# Patient Record
Sex: Male | Born: 1975 | Race: White | Hispanic: No | Marital: Married | Smoking: Never smoker
Health system: Southern US, Community
[De-identification: ages and names within clinical notes are randomized; demographics above are authoritative.]

## PROBLEM LIST (undated history)

## (undated) DIAGNOSIS — E559 Vitamin D deficiency, unspecified: Secondary | ICD-10-CM

## (undated) DIAGNOSIS — B019 Varicella without complication: Secondary | ICD-10-CM

## (undated) DIAGNOSIS — T7840XA Allergy, unspecified, initial encounter: Secondary | ICD-10-CM

## (undated) HISTORY — DX: Allergy, unspecified, initial encounter: T78.40XA

## (undated) HISTORY — DX: Varicella without complication: B01.9

---

## 2011-08-02 ENCOUNTER — Other Ambulatory Visit: Payer: Self-pay | Admitting: Neurosurgery

## 2011-08-02 DIAGNOSIS — M5020 Other cervical disc displacement, unspecified cervical region: Secondary | ICD-10-CM

## 2011-08-03 ENCOUNTER — Other Ambulatory Visit: Payer: Self-pay

## 2011-08-06 ENCOUNTER — Ambulatory Visit
Admission: RE | Admit: 2011-08-06 | Discharge: 2011-08-06 | Disposition: A | Discharge status: Home / Self Care | Payer: 59 | Source: Ambulatory Visit | Attending: Neurosurgery | Admitting: Neurosurgery

## 2011-08-06 DIAGNOSIS — M5020 Other cervical disc displacement, unspecified cervical region: Secondary | ICD-10-CM

## 2011-08-06 MED ORDER — TRIAMCINOLONE ACETONIDE 40 MG/ML IJ SUSP (RADIOLOGY)
60.0000 mg | Freq: Once | INTRAMUSCULAR | Status: AC
  Administered 2011-08-06: 60 mg via EPIDURAL

## 2011-08-06 MED ORDER — IOHEXOL 300 MG/ML  SOLN
1.0000 mL | Freq: Once | INTRAMUSCULAR | Status: AC | PRN
  Administered 2011-08-06: 1 mL via EPIDURAL

## 2013-11-02 ENCOUNTER — Other Ambulatory Visit: Payer: Self-pay | Admitting: Family Medicine

## 2013-11-02 MED ORDER — OSELTAMIVIR PHOSPHATE 75 MG PO CAPS: 75.0000 mg | ORAL_CAPSULE | Freq: Every day | ORAL | Status: DC

## 2014-01-11 ENCOUNTER — Encounter: Payer: Self-pay | Admitting: Family Medicine

## 2015-01-13 ENCOUNTER — Ambulatory Visit (INDEPENDENT_AMBULATORY_CARE_PROVIDER_SITE_OTHER): Payer: BLUE CROSS/BLUE SHIELD | Admitting: Family Medicine

## 2015-01-13 ENCOUNTER — Encounter: Payer: Self-pay | Admitting: Family Medicine

## 2015-01-13 VITALS — BP 109/70 | HR 68 | Temp 97.8°F | Ht 70.0 in | Wt 207.8 lb

## 2015-01-13 DIAGNOSIS — Z0000 Encounter for general adult medical examination without abnormal findings: Secondary | ICD-10-CM

## 2015-01-13 DIAGNOSIS — Z23 Encounter for immunization: Secondary | ICD-10-CM | POA: Diagnosis not present

## 2015-01-13 DIAGNOSIS — Z1212 Encounter for screening for malignant neoplasm of rectum: Secondary | ICD-10-CM | POA: Diagnosis not present

## 2015-01-13 LAB — POCT CBC
Granulocyte percent: 71.5 %G (ref 37–80)
HEMATOCRIT: 48.2 % (ref 43.5–53.7)
HEMOGLOBIN: 15.6 g/dL (ref 14.1–18.1)
Lymph, poc: 2.5 (ref 0.6–3.4)
MCH: 31 pg (ref 27–31.2)
MCHC: 32.4 g/dL (ref 31.8–35.4)
MCV: 95.5 fL (ref 80–97)
MPV: 8.4 fL (ref 0–99.8)
PLATELET COUNT, POC: 237 10*3/uL (ref 142–424)
POC Granulocyte: 6.6 (ref 2–6.9)
POC LYMPH %: 27.2 % (ref 10–50)
RBC: 5.05 M/uL (ref 4.69–6.13)
RDW, POC: 11.9 %
WBC: 9.3 10*3/uL (ref 4.6–10.2)

## 2015-01-13 NOTE — Progress Notes (Signed)
Subjective:  Patient ID: Julian Tran, male    DOB: 1976-04-24  Age: 39 y.o. MRN: [id number]  CC: Annual Exam   HPI Julian Tran presents for complete physical examination  History Julian Tran has a past medical history of Ruptured disc, cervical.   Julian Tran has no past surgical history on file.   His family history includes Alcohol abuse in his father; Depression in his mother.Julian Tran reports that Julian Tran has never smoked. Julian Tran does not have any smokeless tobacco history on file. Julian Tran reports that Julian Tran drinks about 1.8 oz of alcohol per week. Julian Tran reports that Julian Tran does not use illicit drugs.  No current outpatient prescriptions on file prior to visit.   No current facility-administered medications on file prior to visit.    ROS Review of Systems  Constitutional: Negative for fever, chills, diaphoresis, activity change, appetite change, fatigue and unexpected weight change.  HENT: Negative for congestion, ear pain, hearing loss, postnasal drip, rhinorrhea, sore throat, tinnitus and trouble swallowing.   Eyes: Negative for photophobia, pain, discharge and redness.  Respiratory: Negative for apnea, cough, choking, chest tightness, shortness of breath, wheezing and stridor.   Cardiovascular: Negative for chest pain, palpitations and leg swelling.  Gastrointestinal: Negative for nausea, vomiting, abdominal pain, diarrhea, constipation, blood in stool and abdominal distention.  Endocrine: Negative for cold intolerance, heat intolerance, polydipsia, polyphagia and polyuria.  Genitourinary: Negative for dysuria, urgency, frequency, hematuria, flank pain, enuresis, difficulty urinating and genital sores.  Musculoskeletal: Negative for joint swelling and arthralgias.  Skin: Negative for color change, rash and wound.  Allergic/Immunologic: Negative for immunocompromised state.  Neurological: Negative for dizziness, tremors, seizures, syncope, facial asymmetry, speech difficulty, weakness, light-headedness,  numbness and headaches.  Hematological: Does not bruise/bleed easily.  Psychiatric/Behavioral: Negative for suicidal ideas, hallucinations, behavioral problems, confusion, sleep disturbance, dysphoric mood, decreased concentration and agitation. The patient is not nervous/anxious and is not hyperactive.     Objective:  BP 109/70 mmHg  Pulse 68  Temp(Src) 97.8 F (36.6 C) (Oral)  Ht 5' 10" (1.778 m)  Wt 207 lb 12.8 oz (94.257 kg)  BMI 29.82 kg/m2  Physical Exam  Constitutional: Julian Tran is oriented to person, place, and time. Julian Tran appears well-developed and well-nourished.  HENT:  Head: Normocephalic and atraumatic.  Mouth/Throat: Oropharynx is clear and moist.  Eyes: EOM are normal. Pupils are equal, round, and reactive to light.  Neck: Normal range of motion. No tracheal deviation present. No thyromegaly present.  Cardiovascular: Normal rate, regular rhythm and normal heart sounds.  Exam reveals no gallop and no friction rub.   No murmur heard. Pulmonary/Chest: Breath sounds normal. Julian Tran has no wheezes. Julian Tran has no rales.  Abdominal: Soft. Julian Tran exhibits no mass. There is no tenderness.  Musculoskeletal: Normal range of motion. Julian Tran exhibits no edema.  Neurological: Julian Tran is alert and oriented to person, place, and time.  Skin: Skin is warm and dry.  Psychiatric: Julian Tran has a normal mood and affect.    Assessment & Plan:   Julian Tran was seen today for annual exam.  Diagnoses and all orders for this visit:  Well adult exam Orders: -     POCT CBC -     CMP14+EGFR -     NMR, lipoprofile -     PSA, total and free -     Thyroid Panel With TSH -     Vit D  25 hydroxy (rtn osteoporosis monitoring)  Screening for malignant neoplasm of the rectum Orders: -     Fecal occult blood, imunochemical  Other orders -     Tdap vaccine greater than or equal to 7yo IM   I have discontinued Mr. Lusty oseltamivir.  No orders of the defined types were placed in this encounter.   Anticipatory guidance  included T DaP vaccine. Safety concerns discussed including seatbelt use. Injury prevention.appropriate diet. Salt avoidance. Regular exercise.repeat exam annually.  Follow-up: Return in about 1 year (around 01/13/2016).  Claretta Fraise, M.D.

## 2015-01-14 LAB — NMR, LIPOPROFILE
Cholesterol: 159 mg/dL (ref 100–199)
HDL Cholesterol by NMR: 55 mg/dL (ref >39)
HDL PARTICLE NUMBER: 36.5 umol/L (ref >=30.5)
LDL PARTICLE NUMBER: 1171 nmol/L — AB (ref <1000)
LDL Size: 20.6 nm (ref >20.5)
LDL-C: 91 mg/dL (ref 0–99)
LP-IR SCORE: 46 — AB (ref <=45)
SMALL LDL PARTICLE NUMBER: 586 nmol/L — AB (ref <=527)
Triglycerides by NMR: 66 mg/dL (ref 0–149)

## 2015-01-14 LAB — VITAMIN D 25 HYDROXY (VIT D DEFICIENCY, FRACTURES): VIT D 25 HYDROXY: 20.5 ng/mL — AB (ref 30.0–100.0)

## 2015-01-14 LAB — CMP14+EGFR
A/G RATIO: 2.3 (ref 1.1–2.5)
ALBUMIN: 5.1 g/dL (ref 3.5–5.5)
ALT: 24 IU/L (ref 0–44)
AST: 19 IU/L (ref 0–40)
Alkaline Phosphatase: 49 IU/L (ref 39–117)
BILIRUBIN TOTAL: 1.6 mg/dL — AB (ref 0.0–1.2)
BUN/Creatinine Ratio: 15 (ref 9–20)
BUN: 12 mg/dL (ref 6–24)
CALCIUM: 9.6 mg/dL (ref 8.7–10.2)
CHLORIDE: 98 mmol/L (ref 97–108)
CO2: 26 mmol/L (ref 18–29)
Creatinine, Ser: 0.8 mg/dL (ref 0.76–1.27)
GFR calc Af Amer: 128 mL/min/{1.73_m2} (ref >59)
GFR, EST NON AFRICAN AMERICAN: 111 mL/min/{1.73_m2} (ref >59)
GLUCOSE: 76 mg/dL (ref 65–99)
Globulin, Total: 2.2 g/dL (ref 1.5–4.5)
POTASSIUM: 4 mmol/L (ref 3.5–5.2)
SODIUM: 143 mmol/L (ref 134–144)
TOTAL PROTEIN: 7.3 g/dL (ref 6.0–8.5)

## 2015-01-14 LAB — PSA, TOTAL AND FREE
PSA, Free Pct: 15.5 %
PSA, Free: 0.17 ng/mL
PSA: 1.1 ng/mL (ref 0.0–4.0)

## 2015-01-14 LAB — THYROID PANEL WITH TSH
Free Thyroxine Index: 2.5 (ref 1.2–4.9)
T3 Uptake Ratio: 24 % (ref 24–39)
T4, Total: 10.5 ug/dL (ref 4.5–12.0)
TSH: 1.29 u[IU]/mL (ref 0.450–4.500)

## 2015-01-16 ENCOUNTER — Other Ambulatory Visit: Payer: Self-pay | Admitting: Family Medicine

## 2015-01-16 LAB — FECAL OCCULT BLOOD, IMMUNOCHEMICAL: Fecal Occult Bld: NEGATIVE

## 2015-01-16 MED ORDER — VITAMIN D (ERGOCALCIFEROL) 1.25 MG (50000 UNIT) PO CAPS: 50000.0000 [IU] | ORAL_CAPSULE | ORAL | Status: DC

## 2015-01-17 ENCOUNTER — Encounter: Payer: Self-pay | Admitting: Family Medicine

## 2015-02-21 ENCOUNTER — Telehealth: Payer: Self-pay | Admitting: Family Medicine

## 2015-02-21 NOTE — Telephone Encounter (Signed)
I will be happy to see him for these things please schedule

## 2015-02-21 NOTE — Telephone Encounter (Signed)
Patient aware and he will call back to schedule.

## 2015-05-13 ENCOUNTER — Encounter: Payer: Self-pay | Admitting: *Deleted

## 2015-06-09 ENCOUNTER — Encounter (HOSPITAL_BASED_OUTPATIENT_CLINIC_OR_DEPARTMENT_OTHER): Payer: Self-pay | Admitting: *Deleted

## 2015-06-09 ENCOUNTER — Other Ambulatory Visit: Payer: Self-pay | Admitting: Urology

## 2015-06-10 ENCOUNTER — Encounter (HOSPITAL_BASED_OUTPATIENT_CLINIC_OR_DEPARTMENT_OTHER): Payer: Self-pay | Admitting: *Deleted

## 2015-06-10 NOTE — Progress Notes (Signed)
NPO AFTER MN.  ARRIVE AT 0945.  NEEDS HG.  

## 2015-06-15 ENCOUNTER — Encounter (HOSPITAL_BASED_OUTPATIENT_CLINIC_OR_DEPARTMENT_OTHER): Payer: Self-pay | Admitting: Anesthesiology

## 2015-06-15 ENCOUNTER — Encounter (HOSPITAL_BASED_OUTPATIENT_CLINIC_OR_DEPARTMENT_OTHER)
Admission: RE | Disposition: A | Discharge status: Home / Self Care | Payer: Self-pay | Source: Ambulatory Visit | Attending: Urology

## 2015-06-15 ENCOUNTER — Ambulatory Visit (HOSPITAL_BASED_OUTPATIENT_CLINIC_OR_DEPARTMENT_OTHER): Payer: BLUE CROSS/BLUE SHIELD | Admitting: Anesthesiology

## 2015-06-15 ENCOUNTER — Ambulatory Visit (HOSPITAL_BASED_OUTPATIENT_CLINIC_OR_DEPARTMENT_OTHER)
Admission: RE | Admit: 2015-06-15 | Discharge: 2015-06-15 | Disposition: A | Discharge status: Home / Self Care | Payer: BLUE CROSS/BLUE SHIELD | Source: Ambulatory Visit | Attending: Urology | Admitting: Urology

## 2015-06-15 DIAGNOSIS — Z302 Encounter for sterilization: Secondary | ICD-10-CM | POA: Insufficient documentation

## 2015-06-15 HISTORY — PX: VASECTOMY: SHX75

## 2015-06-15 HISTORY — DX: Vitamin D deficiency, unspecified: E55.9

## 2015-06-15 LAB — POCT HEMOGLOBIN-HEMACUE: Hemoglobin: 17 g/dL (ref 13.0–17.0)

## 2015-06-15 SURGERY — VASECTOMY
Anesthesia: General | Site: Scrotum | Laterality: Bilateral

## 2015-06-15 MED ORDER — CEFAZOLIN SODIUM 1-5 GM-% IV SOLN
1.0000 g | INTRAVENOUS | Status: DC
  Filled 2015-06-15: qty 50

## 2015-06-15 MED ORDER — BUPIVACAINE HCL (PF) 0.25 % IJ SOLN
INTRAMUSCULAR | Status: DC | PRN
  Administered 2015-06-15: 10 mL

## 2015-06-15 MED ORDER — ONDANSETRON HCL 4 MG/2ML IJ SOLN
INTRAMUSCULAR | Status: DC | PRN
  Administered 2015-06-15: 4 mg via INTRAVENOUS

## 2015-06-15 MED ORDER — FENTANYL CITRATE (PF) 100 MCG/2ML IJ SOLN
25.0000 ug | INTRAMUSCULAR | Status: DC | PRN
  Filled 2015-06-15: qty 1

## 2015-06-15 MED ORDER — PROPOFOL 10 MG/ML IV BOLUS
INTRAVENOUS | Status: DC | PRN
  Administered 2015-06-15: 100 mg via INTRAVENOUS
  Administered 2015-06-15: 200 mg via INTRAVENOUS

## 2015-06-15 MED ORDER — LACTATED RINGERS IV SOLN
INTRAVENOUS | Status: DC
  Filled 2015-06-15: qty 1000

## 2015-06-15 MED ORDER — DEXAMETHASONE SODIUM PHOSPHATE 4 MG/ML IJ SOLN
INTRAMUSCULAR | Status: DC | PRN
  Administered 2015-06-15: 10 mg via INTRAVENOUS

## 2015-06-15 MED ORDER — MIDAZOLAM HCL 5 MG/5ML IJ SOLN
INTRAMUSCULAR | Status: DC | PRN
  Administered 2015-06-15: 2 mg via INTRAVENOUS

## 2015-06-15 MED ORDER — LIDOCAINE HCL (CARDIAC) 20 MG/ML IV SOLN
INTRAVENOUS | Status: DC | PRN
Start: 2015-06-15 — End: 2015-06-15
  Administered 2015-06-15: 100 mg via INTRAVENOUS

## 2015-06-15 MED ORDER — LACTATED RINGERS IV SOLN
INTRAVENOUS | Status: DC
  Administered 2015-06-15: 11:00:00 via INTRAVENOUS
  Filled 2015-06-15: qty 1000

## 2015-06-15 MED ORDER — KETOROLAC TROMETHAMINE 30 MG/ML IJ SOLN
INTRAMUSCULAR | Status: DC | PRN
  Administered 2015-06-15: 30 mg via INTRAVENOUS

## 2015-06-15 MED ORDER — OXYCODONE HCL 5 MG PO TABS
ORAL_TABLET | ORAL | Status: AC
  Filled 2015-06-15: qty 1

## 2015-06-15 MED ORDER — FENTANYL CITRATE (PF) 100 MCG/2ML IJ SOLN
INTRAMUSCULAR | Status: DC | PRN
  Administered 2015-06-15: 100 ug via INTRAVENOUS

## 2015-06-15 MED ORDER — MEPERIDINE HCL 25 MG/ML IJ SOLN
6.2500 mg | INTRAMUSCULAR | Status: DC | PRN
  Filled 2015-06-15: qty 1

## 2015-06-15 MED ORDER — CEFAZOLIN SODIUM-DEXTROSE 2-3 GM-% IV SOLR
2.0000 g | INTRAVENOUS | Status: AC
  Administered 2015-06-15: 2 g via INTRAVENOUS
  Filled 2015-06-15: qty 50

## 2015-06-15 MED ORDER — HYDROCODONE-ACETAMINOPHEN 5-325 MG PO TABS: 1.0000 | ORAL_TABLET | Freq: Four times a day (QID) | ORAL | Status: DC | PRN

## 2015-06-15 MED ORDER — FENTANYL CITRATE (PF) 100 MCG/2ML IJ SOLN
INTRAMUSCULAR | Status: AC
  Filled 2015-06-15: qty 2

## 2015-06-15 MED ORDER — OXYCODONE HCL 5 MG PO TABS
5.0000 mg | ORAL_TABLET | Freq: Once | ORAL | Status: AC
  Administered 2015-06-15: 5 mg via ORAL
  Filled 2015-06-15: qty 1

## 2015-06-15 MED ORDER — MIDAZOLAM HCL 2 MG/2ML IJ SOLN
INTRAMUSCULAR | Status: AC
  Filled 2015-06-15: qty 2

## 2015-06-15 MED ORDER — ACETAMINOPHEN 10 MG/ML IV SOLN
INTRAVENOUS | Status: DC | PRN
Start: 2015-06-15 — End: 2015-06-15
  Administered 2015-06-15: 1000 mg via INTRAVENOUS

## 2015-06-15 MED ORDER — PROMETHAZINE HCL 25 MG/ML IJ SOLN
6.2500 mg | INTRAMUSCULAR | Status: DC | PRN
  Filled 2015-06-15: qty 1

## 2015-06-15 MED ORDER — CEFAZOLIN SODIUM-DEXTROSE 2-3 GM-% IV SOLR
INTRAVENOUS | Status: AC
  Filled 2015-06-15: qty 50

## 2015-06-15 SURGICAL SUPPLY — 35 items
BLADE CLIPPER SURG (BLADE) IMPLANT
BLADE SURG 15 STRL LF DISP TIS (BLADE) ×1 IMPLANT
BLADE SURG 15 STRL SS (BLADE) ×2
BNDG GAUZE ELAST 4 BULKY (GAUZE/BANDAGES/DRESSINGS) ×3 IMPLANT
CLOTH BEACON ORANGE TIMEOUT ST (SAFETY) ×3 IMPLANT
COVER BACK TABLE 60X90IN (DRAPES) ×3 IMPLANT
COVER MAYO STAND STRL (DRAPES) ×3 IMPLANT
DRAPE PED LAPAROTOMY (DRAPES) ×3 IMPLANT
ELECT NEEDLE TIP 2.8 STRL (NEEDLE) ×3 IMPLANT
ELECT REM PT RETURN 9FT ADLT (ELECTROSURGICAL) ×3
ELECTRODE REM PT RTRN 9FT ADLT (ELECTROSURGICAL) ×1 IMPLANT
GLOVE BIO SURGEON STRL SZ8 (GLOVE) ×3 IMPLANT
GLOVE BIOGEL M 6.5 STRL (GLOVE) ×3 IMPLANT
GLOVE BIOGEL PI IND STRL 6.5 (GLOVE) ×1 IMPLANT
GLOVE BIOGEL PI IND STRL 7.5 (GLOVE) ×1 IMPLANT
GLOVE BIOGEL PI INDICATOR 6.5 (GLOVE) ×2
GLOVE BIOGEL PI INDICATOR 7.5 (GLOVE) ×2
GOWN STRL REUS W/TWL LRG LVL3 (GOWN DISPOSABLE) ×3 IMPLANT
GOWN STRL REUS W/TWL XL LVL3 (GOWN DISPOSABLE) ×3 IMPLANT
GOWN W/2 COTTON TOWELS 2 STD (GOWNS) IMPLANT
MANIFOLD NEPTUNE II (INSTRUMENTS) IMPLANT
NEEDLE HYPO 25X1 1.5 SAFETY (NEEDLE) IMPLANT
NEEDLE HYPO 25X5/8 SAFETYGLIDE (NEEDLE) IMPLANT
NS IRRIG 500ML POUR BTL (IV SOLUTION) IMPLANT
PACK BASIN DAY SURGERY FS (CUSTOM PROCEDURE TRAY) ×3 IMPLANT
PENCIL BUTTON HOLSTER BLD 10FT (ELECTRODE) ×3 IMPLANT
SUPPORT SCROTAL LG STRP (MISCELLANEOUS) ×2 IMPLANT
SUPPORTER ATHLETIC LG (MISCELLANEOUS) ×1
SUT CHROMIC 3 0 PS 2 (SUTURE) ×3 IMPLANT
SUT SILK 2 0 (SUTURE) ×2
SUT SILK 2-0 18XBRD TIE 12 (SUTURE) ×1 IMPLANT
SYR CONTROL 10ML LL (SYRINGE) IMPLANT
TOWEL OR 17X24 6PK STRL BLUE (TOWEL DISPOSABLE) ×3 IMPLANT
TRAY DSU PREP LF (CUSTOM PROCEDURE TRAY) ×3 IMPLANT
WATER STERILE IRR 500ML POUR (IV SOLUTION) IMPLANT

## 2015-06-15 NOTE — Op Note (Signed)
Preoperative diagnosis: Desires Sterilization Postop diagnosis: Same  Procedure: 1.  Vasectomy  Attending: Nicolette Bang  Anesthesia: General  Estimated blood loss: 5 cc  Drains: 1. none  Specimens: bilateral cross section of vas deferens  Antibiotics: none  Findings: bilateral thickened cord with difficult to palpate vas deferens  Indications: Patient is a 38 year old who desires sterilization. After discussing options he desires to proceed with vasectomy.  Procedure in detail: Prior to procedure consent was obtained. Patient was brought to the operating room and briefing was done sure correct patient, correct procedure, correct site.  General anesthesia was in administered patient was placed in the supine position.  The patients genetalia was prepped and draped in the usual, sterile fashion.  A 0.5 cm incision was made in the left hemiscrotum.  The vas was then brought to the level of the incision.  Then using a vas grasper we isolated the vas deferens.  We then used electrocautery to free the vas from the perivasal tissue.  Once the vas was isolated we then placed a clamp on either side of the vas.  Then sharply incised the vas and removed a 1 cm section of vas deferens.  We then cauterized the ends of the vein as and then ligated the vessels with 0 silk suture.  We then placed the individual limbs of vas deferens and separate compartments.  We closed the overlying skin with 3-0 chromic in interrupted fashion.  A similar technique was used on the left side to isolate the left vas.  Left vas was ligated in a similar fashion.  Good hemostasis was also noted in the left hemiscrotum.  We then returned the ends of the vas to separate compartments.  We then closed the overlying skin with 3-0 chromic in an interrupted fashion. This then concluded the procedure which was well tolerated by the patient. Complications: None Condition: Stable, x-rayed, transferred to PACU. Plan: Pt is to be  discharged home. Followup in 6-8 weeks with a semen sample

## 2015-06-15 NOTE — Discharge Instructions (Signed)
Vasectomy, Care After Refer to this sheet in the next few weeks. These instructions provide you with information on caring for yourself after your procedure. Your health care provider may also give you more specific instructions. Your treatment has been planned according to current medical practices, but problems sometimes occur. Call your health care provider if you have any problems or questions after your procedure. WHAT TO EXPECT AFTER THE PROCEDURE After your procedure, it is typical to have the following:  Slight swelling or redness or both at the surgical site.  Mild pain or discomfort in the scrotum.  Some oozing of blood from the cuts (incisions) made by the surgeon is normal during the first day or two after the procedure.  Blood in the ejaculate is common and typically clears after a few days. HOME CARE INSTRUCTIONS   Only take over-the-counter or prescription medicines for pain, discomfort, or fever as directed by your health care provider.  Avoid using nonsteroidal anti-inflammatory drugs (NSAIDs) because these can make bleeding worse.  Apply ice to the injured area:  Put ice in a plastic bag.  Place a towel between your skin and the bag.  Leave the ice on for 20 minutes, 2-3 times a day.  Avoid being active for the first 2 days after surgery.  Wear a supporter while moving around for the first week after surgery. You may add some sterile fluffed bandages or a clean washcloth to the scrotal support if the scrotal support irritates your skin.  Do not participate in sports or perform heavy physical labor for at least 2 weeks.  You may have protected intercourse 7-10 days after your procedure. Remember, you are not sterile until follow-up specimens show no sperm in your ejaculate.  Be sure to follow up with your surgeon as instructed to confirm sterility. It usually requires multiple ejaculations to clear the sperm located beyond the vasectomy site of blockage. You will  need at least two specimens showing an absence of sperm before you can resume unprotected intercourse. SEEK MEDICAL CARE IF:   You have redness, swelling, or increasing pain in the wounds or testicles (scrotum).  You see pus coming from the wound.  You have a fever.  You notice a foul smell coming from the wound or dressing.  You notice a breaking open of the stitches (suture) line or wound edges even after sutures have been removed.  You have increased bleeding from the wounds. SEEK IMMEDIATE MEDICAL CARE IF:   You develop a rash.  You have difficulty breathing.  You have any reaction or side effects to medicines given. MAKE SURE YOU:  Understand these instructions.  Will watch your condition.  Will get help right away if you are not doing well or get worse. Document Released: 04/27/2005 Document Revised: 10/13/2013 Document Reviewed: 04/27/2013 [facility] Patient Information 2015 [place], [place]. This information is not intended to replace advice given to you by your health care provider. Make sure you discuss any questions you have with your health care provider.    Vasectomy A vasectomy is tying (with or without cutting) the tube that collects the sperm from the testicle (vas deferens). The vasectomy blocks the sperm from going through the vas deferens and penis so that during sexual intercourse, the sperm does not go into the vagina. Vasectomy is safe, with very rare complications. It does not affect your sexual desire or performance. A vasectomy does not prevent sexually transmitted diseases. Because vasectomy is considered permanent, you should not have it done  until you are sure you do not want any more children. You and your partner should be in full agreement to have the procedure. Your decision to have a vasectomy should not be made during a stressful situation. This includes loss of a pregnancy, illness, death of a spouse, or divorce. There are other means of  contraception that can be used until you are completely sure you want this procedure done.  LET [facility] CARE PROVIDER KNOW ABOUT:   Any allergies you have.  All medicines you are taking, including vitamins, herbs, eye drops, creams, and over-the-counter medicines.  Previous problems you or members of your family have had with the use of anesthetics.  Any blood disorders you have.  Previous surgeries you have had.  Medical conditions you have. RISKS AND COMPLICATIONS Generally, vasectomy is a safe procedure. However, as with any procedure, complications can occur. Possible complications include:  Failure of the procedure to cause infertility. This means you would still be able to get a male pregnant. Even after sterilization has been achieved, there is a 1 in 10,000 chance that the two cut ends may reconnect (recanalization).  Infection. A germ starts growing in the wound. This can usually be treated with antibiotic medicine(s).  An allergic reaction to the anesthetic or other medicine given.  Bleeding. Blood may seep under the skin so that the scrotum and penis appear to be bruised. Sometimes the scrotum can swell and get the size of a grapefruit. This usually disappears without treatment within a week or two. BEFORE THE PROCEDURE  Do not take aspirin or aspirin-containing products for 7 days prior to your procedure.  Do not take nonsteroidal anti-inflammatory products for 7 days prior to your procedure.  You may be instructed to wash with soap before coming in for your procedure. PROCEDURE  The scrotum is cleaned with bacteria-killing soap, and the health care provider finds the vas deferens.  Each side of the scrotum is numbed.  A very small cut (incision) is made, and the vas deferens are pulled out of the scrotum. The vas deferens are then tied off, cut, or may be burned (cauterized) at the ends.  Sometimes the vas deferens are pulled out from the scrotum through a  puncture wound. This is done with a special instrument without an incision.  The vas deferens are then put back into the scrotum, and the incision or puncture wound is closed. Absorbable suture material that will dissolve and not need to be removed is commonly used.  After surgery, sperm may still be left in the vas deferens for 1-3 months. Because of this, other means of contraception should be used until your health care provider examines you and finds there are no sperm in your seminal fluid. AFTER THE PROCEDURE After the procedure, you will be taken to the recovery area. Your progress will be watched and checked. Once you are awake, stable, and taking fluids well, you will be allowed to go home as long as there are no problems.  Document Released: 12/29/2002 Document Revised: 10/13/2013 Document Reviewed: 04/27/2013 [facility] Patient Information 2015 [place], [place]. This information is not intended to replace advice given to you by your health care provider. Make sure you discuss any questions you have with your health care provider.     Post Anesthesia Home Care Instructions  Activity: Get plenty of rest for the remainder of the day. A responsible adult should stay with you for 24 hours following the procedure.  For the next 24  hours, DO NOT: -Drive a car -Paediatric nurse -Drink alcoholic beverages -Take any medication unless instructed by your physician -Make any legal decisions or sign important papers.  Meals: Start with liquid foods such as gelatin or soup. Progress to regular foods as tolerated. Avoid greasy, spicy, heavy foods. If nausea and/or vomiting occur, drink only clear liquids until the nausea and/or vomiting subsides. Call your physician if vomiting continues.  Special Instructions/Symptoms: Your throat may feel dry or sore from the anesthesia or the breathing tube placed in your throat during surgery. If this causes discomfort, gargle with warm salt water. The  discomfort should disappear within 24 hours.  If you had a scopolamine patch placed behind your ear for the management of post- operative nausea and/or vomiting:  1. The medication in the patch is effective for 72 hours, after which it should be removed.  Wrap patch in a tissue and discard in the trash. Wash hands thoroughly with soap and water. 2. You may remove the patch earlier than 72 hours if you experience unpleasant side effects which may include dry mouth, dizziness or visual disturbances. 3. Avoid touching the patch. Wash your hands with soap and water after contact with the patch.

## 2015-06-15 NOTE — Transfer of Care (Signed)
Immediate Anesthesia Transfer of Care Note  Patient: Julian Tran  Procedure(s) Performed: Procedure(s): VASECTOMY (Bilateral)  Patient Location: PACU  Anesthesia Type:General  Level of Consciousness: awake and oriented  Airway & Oxygen Therapy: Patient Spontanous Breathing and Patient connected to nasal cannula oxygen  Post-op Assessment: Report given to RN  Post vital signs: Reviewed and stable  Last Vitals:  Filed Vitals:   06/15/15 1000  BP: 116/69  Pulse: 63  Temp: 36.9 C  Resp: 12    Complications: No apparent anesthesia complications

## 2015-06-15 NOTE — Anesthesia Procedure Notes (Signed)
Procedure Name: LMA Insertion Date/Time: 06/15/2015 11:15 AM Performed by: Bethena Roys T Pre-anesthesia Checklist: Patient identified, Emergency Drugs available, Suction available and Patient being monitored Patient Re-evaluated:Patient Re-evaluated prior to inductionOxygen Delivery Method: Circle System Utilized Preoxygenation: Pre-oxygenation with 100% oxygen Intubation Type: IV induction Ventilation: Mask ventilation without difficulty LMA: LMA inserted LMA Size: 5.0 Number of attempts: 1 Airway Equipment and Method: Bite block Placement Confirmation: positive ETCO2 Tube secured with: Tape Dental Injury: Teeth and Oropharynx as per pre-operative assessment

## 2015-06-15 NOTE — Brief Op Note (Signed)
06/15/2015  11:43 AM  PATIENT:  Julian Tran  39 y.o. male  PRE-OPERATIVE DIAGNOSIS:  DESIRES STERILIZATION  POST-OPERATIVE DIAGNOSIS:  DESIRES STERILIZATION  PROCEDURE:  Procedure(s): VASECTOMY (Bilateral)  SURGEON:  Surgeon(s) and Role:    * Cleon Gustin, MD - Primary  PHYSICIAN ASSISTANT:   ASSISTANTS: none   ANESTHESIA:   local and general  EBL:  Total I/O In: 200 [I.V.:200] Out: -   BLOOD ADMINISTERED:none  DRAINS: none   LOCAL MEDICATIONS USED:  MARCAINE     SPECIMEN:  Source of Specimen:  bilateral vas cross section  DISPOSITION OF SPECIMEN:  PATHOLOGY  COUNTS:  YES  TOURNIQUET:  * No tourniquets in log *  DICTATION: .Note written in EPIC  PLAN OF CARE: Discharge to home after PACU  PATIENT DISPOSITION:  PACU - hemodynamically stable.   Delay start of Pharmacological VTE agent (>24hrs) due to surgical blood loss or risk of bleeding: not applicable

## 2015-06-15 NOTE — Anesthesia Preprocedure Evaluation (Signed)
Anesthesia Evaluation  Patient identified by MRN, date of birth, ID band Patient awake    Reviewed: Allergy & Precautions, NPO status , Patient's Chart, lab work & pertinent test results  Airway Mallampati: II  TM Distance: >3 FB Neck ROM: Full    Dental no notable dental hx.    Pulmonary neg pulmonary ROS,  breath sounds clear to auscultation  Pulmonary exam normal       Cardiovascular negative cardio ROS Normal cardiovascular examRhythm:Regular Rate:Normal     Neuro/Psych negative neurological ROS  negative psych ROS   GI/Hepatic negative GI ROS, Neg liver ROS,   Endo/Other  negative endocrine ROS  Renal/GU negative Renal ROS  negative genitourinary   Musculoskeletal negative musculoskeletal ROS (+)   Abdominal   Peds negative pediatric ROS (+)  Hematology negative hematology ROS (+)   Anesthesia Other Findings   Reproductive/Obstetrics negative OB ROS                             Anesthesia Physical Anesthesia Plan  ASA: I  Anesthesia Plan: General   Post-op Pain Management:    Induction: Intravenous  Airway Management Planned: LMA  Additional Equipment:   Intra-op Plan:   Post-operative Plan: Extubation in OR  Informed Consent: I have reviewed the patients History and Physical, chart, labs and discussed the procedure including the risks, benefits and alternatives for the proposed anesthesia with the patient or authorized representative who has indicated his/her understanding and acceptance.   Dental advisory given  Plan Discussed with: CRNA  Anesthesia Plan Comments:         Anesthesia Quick Evaluation

## 2015-06-15 NOTE — Anesthesia Postprocedure Evaluation (Signed)
  Anesthesia Post-op Note  Patient: Julian Tran  Procedure(s) Performed: Procedure(s) (LRB): VASECTOMY (Bilateral)  Patient Location: PACU  Anesthesia Type: General  Level of Consciousness: awake and alert   Airway and Oxygen Therapy: Patient Spontanous Breathing  Post-op Pain: mild  Post-op Assessment: Post-op Vital signs reviewed, Patient's Cardiovascular Status Stable, Respiratory Function Stable, Patent Airway and No signs of Nausea or vomiting  Last Vitals:  Filed Vitals:   06/15/15 1210  BP:   Pulse: 52  Temp:   Resp: 18    Post-op Vital Signs: stable   Complications: No apparent anesthesia complications

## 2015-06-16 ENCOUNTER — Encounter (HOSPITAL_BASED_OUTPATIENT_CLINIC_OR_DEPARTMENT_OTHER): Payer: Self-pay | Admitting: Urology

## 2015-06-18 NOTE — H&P (Signed)
Urology Admission H&P  Chief Complaint: desires sterilization  History of Present Illness: Julian Tran is a 39yo here for vasectomy. He has 2 healthy children. On office exam his vas are small and difficult to palpate.   Past Medical History  Diagnosis Date  . Vitamin D deficiency   . GERD (gastroesophageal reflux disease)    Past Surgical History  Procedure Laterality Date  . No past surgeries    . Vasectomy Bilateral 06/15/2015    Procedure: VASECTOMY;  Surgeon: Cleon Gustin, MD;  Location: [facility];  Service: Urology;  Laterality: Bilateral;    Home Medications:  No prescriptions prior to admission   Allergies: No Known Allergies  Family History  Problem Relation Age of Onset  . Depression Mother   . Alcohol abuse Father    Social History:  reports that he has never smoked. He has never used smokeless tobacco. He reports that he drinks about 1.8 oz of alcohol per week. He reports that he does not use illicit drugs.  Review of Systems  All other systems reviewed and are negative.   Physical Exam:  Vital signs in last 24 hours:   Physical Exam  Constitutional: He is oriented to person, place, and time. He appears well-developed and well-nourished.  HENT:  Head: Normocephalic and atraumatic.  Eyes: EOM are normal. Pupils are equal, round, and reactive to light.  Neck: Normal range of motion. Neck supple.  Cardiovascular: Normal rate and regular rhythm.   Respiratory: Effort normal and breath sounds normal.  GI: Soft. Bowel sounds are normal.  Genitourinary: Penis normal.  Musculoskeletal: Normal range of motion.  Neurological: He is alert and oriented to person, place, and time.  Skin: Skin is warm and dry.  Psychiatric: He has a normal mood and affect. His behavior is normal. Judgment and thought content normal.    Laboratory Data:  No results found for this or any previous visit (from the past 24 hour(s)). No results found for this or any  previous visit (from the past 240 hour(s)). Creatinine: No results for input(s): CREATININE in the last 168 hours.   Impression/Assessment:  Desires sterilization  Plan:  Risks/benefits/alternatives to vasectomy was explained to the patient and he understands and wishes to proceed with surgery  Julian Tran 06/18/2015, 8:02 AM

## 2015-07-20 ENCOUNTER — Ambulatory Visit: Payer: BLUE CROSS/BLUE SHIELD | Admitting: Family Medicine

## 2015-07-26 ENCOUNTER — Ambulatory Visit: Payer: BLUE CROSS/BLUE SHIELD | Admitting: Family Medicine

## 2016-01-12 ENCOUNTER — Encounter: Payer: Self-pay | Admitting: Internal Medicine

## 2016-01-12 ENCOUNTER — Ambulatory Visit (INDEPENDENT_AMBULATORY_CARE_PROVIDER_SITE_OTHER): Payer: BLUE CROSS/BLUE SHIELD | Admitting: Internal Medicine

## 2016-01-12 VITALS — BP 110/80 | HR 67 | Temp 98.4°F | Ht 69.5 in | Wt 217.0 lb

## 2016-01-12 DIAGNOSIS — H6982 Other specified disorders of Eustachian tube, left ear: Secondary | ICD-10-CM | POA: Diagnosis not present

## 2016-01-12 DIAGNOSIS — H65111 Acute and subacute allergic otitis media (mucoid) (sanguinous) (serous), right ear: Secondary | ICD-10-CM

## 2016-01-12 MED ORDER — PREDNISONE 10 MG PO TABS: ORAL_TABLET | ORAL | Status: DC

## 2016-01-12 MED ORDER — CEFUROXIME AXETIL 500 MG PO TABS
500.0000 mg | ORAL_TABLET | Freq: Two times a day (BID) | ORAL | Status: DC
Start: 2016-01-12 — End: 2016-07-12

## 2016-01-12 NOTE — Patient Instructions (Signed)

## 2016-01-12 NOTE — Progress Notes (Signed)
HPI  Julian Tran presents to the clinic today to establish care. He is transferring care from Dr. Livia Snellen in [place].   He went to [facility] 1 week ago. He was diagnosed with an ear infection (unsure of which ear) and bronchitis. He was treated with Augmentin, Albuterol and Tessalon. He does feel like his respiratory symptoms have improved. He has had some difficulty hearing since that time. He denies fever, chills or body aches. He has not tried anything OTC>  Flu: 07/2015 Tetanus: 12/2014 PSA Screen: 12/2014 Vision Screening: as needed Dentist: annually  Past Medical History  Diagnosis Date  . Vitamin D deficiency   . Chicken pox     No current outpatient prescriptions on file.   No current facility-administered medications for this visit.    No Known Allergies  Family History  Problem Relation Age of Onset  . Depression Mother   . Alcohol abuse Father   . Alcohol abuse Brother   . Alcohol abuse Brother   . Diabetes Neg Hx   . Heart disease Neg Hx   . Stroke Neg Hx     Social History   Social History  . Marital Status: Married    Spouse Name: N/A  . Number of Children: N/A  . Years of Education: N/A   Occupational History  . Not on file.   Social History Main Topics  . Smoking status: Never Smoker   . Smokeless tobacco: Never Used  . Alcohol Use: 1.8 oz/week    3 Standard drinks or equivalent per week     Comment: OCCASIONAL  . Drug Use: No  . Sexual Activity: Yes    Birth Control/ Protection: Surgical   Other Topics Concern  . Not on file   Social History Narrative    ROS:  Constitutional: Denies fever, malaise, fatigue, headache or abrupt weight changes.  HEENT: Julian Tran reports ear pressure, difficulty hearing. Denies eye pain, eye redness, ear pain, ringing in the ears, wax buildup, runny nose, nasal congestion, bloody nose, or sore throat. Respiratory: Denies difficulty breathing, shortness of breath, cough or sputum production.   Cardiovascular:  Denies chest pain, chest tightness, palpitations or swelling in the hands or feet.  Neurological: Denies dizziness, difficulty with memory, difficulty with speech or problems with balance and coordination.  Psych: Denies anxiety, depression, SI/HI.  No other specific complaints in a complete review of systems (except as listed in HPI above).  PE:  BP 110/80 mmHg  Pulse 67  Temp(Src) 98.4 F (36.9 C) (Oral)  Ht 5' 9.5" (1.765 m)  Wt 217 lb (98.431 kg)  BMI 31.60 kg/m2  SpO2 98% Wt Readings from Last 3 Encounters:  01/12/16 217 lb (98.431 kg)  06/15/15 210 lb (95.255 kg)  01/13/15 207 lb 12.8 oz (94.257 kg)    General: Appears his stated age, well developed, well nourished in NAD. HEENT: Head: normal shape and size; Eyes: sclera white, no icterus, conjunctiva pink; Right Ear: Tm's red and intact, dull light reflex, + mucous effusion; Left Ear: TM gray and intact, normal light reflex, + serous effusion; Throat/Mouth: Teeth present, mucosa pink and moist, no lesions or ulcerations noted.  Neck: No adenopathy noted.  Cardiovascular: Normal rate and rhythm. S1,S2 noted.  No murmur, rubs or gallops noted.  Pulmonary/Chest: Normal effort and positive vesicular breath sounds. No respiratory distress. No wheezes, rales or ronchi noted.  Neurological: Alert and oriented.  Psychiatric: Mood and affect normal. Behavior is normal. Judgment and thought content normal.  BMET    Component Value Date/Time   NA 143 01/13/2015 1524   K 4.0 01/13/2015 1524   CL 98 01/13/2015 1524   CO2 26 01/13/2015 1524   GLUCOSE 76 01/13/2015 1524   BUN 12 01/13/2015 1524   CREATININE 0.80 01/13/2015 1524   CALCIUM 9.6 01/13/2015 1524   GFRNONAA 111 01/13/2015 1524   GFRAA 128 01/13/2015 1524    Lipid Panel     Component Value Date/Time   CHOL 159 01/13/2015 1524   TRIG 66 01/13/2015 1524   HDL 55 01/13/2015 1524    CBC    Component Value Date/Time   WBC 9.3 01/13/2015 1536   RBC 5.05  01/13/2015 1536   HGB 17.0 06/15/2015 1055   HGB 15.6 01/13/2015 1536   HCT 48.2 01/13/2015 1536   MCV 95.5 01/13/2015 1536   MCH 31.0 01/13/2015 1536   MCHC 32.4 01/13/2015 1536    Hgb A1C No results found for: HGBA1C   Assessment and Plan:  Right Otitis Media:  Failed Augmentin eRx for Ceftin BID x 10 days If persist, will refer to ENT  ETD, left:  eRx for Pred Taper  RTC as needed or if symptoms persist or worsen

## 2016-01-12 NOTE — Progress Notes (Signed)
Pre visit review using our clinic review tool, if applicable. No additional management support is needed unless otherwise documented below in the visit note. 

## 2016-07-12 ENCOUNTER — Encounter: Payer: Self-pay | Admitting: Internal Medicine

## 2016-07-12 ENCOUNTER — Ambulatory Visit (INDEPENDENT_AMBULATORY_CARE_PROVIDER_SITE_OTHER): Payer: BLUE CROSS/BLUE SHIELD | Admitting: Internal Medicine

## 2016-07-12 VITALS — BP 114/78 | HR 76 | Temp 98.2°F | Ht 69.5 in | Wt 210.5 lb

## 2016-07-12 DIAGNOSIS — Z0000 Encounter for general adult medical examination without abnormal findings: Secondary | ICD-10-CM

## 2016-07-12 DIAGNOSIS — Z0001 Encounter for general adult medical examination with abnormal findings: Secondary | ICD-10-CM | POA: Diagnosis not present

## 2016-07-12 DIAGNOSIS — Z114 Encounter for screening for human immunodeficiency virus [HIV]: Secondary | ICD-10-CM

## 2016-07-12 DIAGNOSIS — R6882 Decreased libido: Secondary | ICD-10-CM

## 2016-07-12 DIAGNOSIS — Z23 Encounter for immunization: Secondary | ICD-10-CM | POA: Diagnosis not present

## 2016-07-12 LAB — CBC
HCT: 47.2 % (ref 39.0–52.0)
Hemoglobin: 16.6 g/dL (ref 13.0–17.0)
MCHC: 35.3 g/dL (ref 30.0–36.0)
MCV: 93.2 fl (ref 78.0–100.0)
Platelets: 197 10*3/uL (ref 150.0–400.0)
RBC: 5.06 Mil/uL (ref 4.22–5.81)
RDW: 12.1 % (ref 11.5–15.5)
WBC: 6.6 10*3/uL (ref 4.0–10.5)

## 2016-07-12 LAB — COMPREHENSIVE METABOLIC PANEL
ALT: 33 U/L (ref 0–53)
AST: 25 U/L (ref 0–37)
Albumin: 4.5 g/dL (ref 3.5–5.2)
Alkaline Phosphatase: 43 U/L (ref 39–117)
BILIRUBIN TOTAL: 1.2 mg/dL (ref 0.2–1.2)
BUN: 13 mg/dL (ref 6–23)
CHLORIDE: 105 meq/L (ref 96–112)
CO2: 32 meq/L (ref 19–32)
CREATININE: 0.87 mg/dL (ref 0.40–1.50)
Calcium: 9.4 mg/dL (ref 8.4–10.5)
GFR: 101.95 mL/min (ref >60.00)
GLUCOSE: 97 mg/dL (ref 70–99)
Potassium: 4.3 mEq/L (ref 3.5–5.1)
SODIUM: 141 meq/L (ref 135–145)
Total Protein: 7.6 g/dL (ref 6.0–8.3)

## 2016-07-12 LAB — LIPID PANEL
CHOL/HDL RATIO: 3
Cholesterol: 151 mg/dL (ref 0–200)
HDL: 52.3 mg/dL (ref >39.00)
LDL CALC: 84 mg/dL (ref 0–99)
NONHDL: 98.8
TRIGLYCERIDES: 72 mg/dL (ref 0.0–149.0)
VLDL: 14.4 mg/dL (ref 0.0–40.0)

## 2016-07-12 LAB — TESTOSTERONE: TESTOSTERONE: 162.79 ng/dL — AB (ref 300.00–890.00)

## 2016-07-12 NOTE — Patient Instructions (Signed)

## 2016-07-12 NOTE — Progress Notes (Signed)
Subjective:    Patient ID: Julian Tran, male    DOB: 06-04-1976, 40 y.o.   MRN: [id number]  HPI  Pt presents to the clinic today for his annual exam.  Flu: 07/2015, wants one today Tetanus: 12/2014 PSA Screen: 12/2014 Vision Screening: 06/2016 Dentist: annually  Diet: He does eat meat. He consumes lots of fruits and veggies. He does eat some fried foods. He drinks mostly water. Exercise: None  Review of Systems      Past Medical History:  Diagnosis Date  . Chicken pox   . Vitamin D deficiency     No current outpatient prescriptions on file.   No current facility-administered medications for this visit.     No Known Allergies  Family History  Problem Relation Age of Onset  . Depression Mother   . Alcohol abuse Father   . Alcohol abuse Brother   . Alcohol abuse Brother   . Diabetes Neg Hx   . Heart disease Neg Hx   . Stroke Neg Hx     Social History   Social History  . Marital status: Married    Spouse name: N/A  . Number of children: N/A  . Years of education: N/A   Occupational History  . Not on file.   Social History Main Topics  . Smoking status: Never Smoker  . Smokeless tobacco: Never Used  . Alcohol use 1.8 oz/week    3 Standard drinks or equivalent per week     Comment: OCCASIONAL  . Drug use: No  . Sexual activity: Yes    Birth control/ protection: Surgical   Other Topics Concern  . Not on file   Social History Narrative  . No narrative on file     Constitutional: Denies fever, malaise, fatigue, headache or abrupt weight changes.  HEENT: Denies eye pain, eye redness, ear pain, ringing in the ears, wax buildup, runny nose, nasal congestion, bloody nose, or sore throat. Respiratory: Denies difficulty breathing, shortness of breath, cough or sputum production.   Cardiovascular: Denies chest pain, chest tightness, palpitations or swelling in the hands or feet.  Gastrointestinal: Denies abdominal pain, bloating, constipation, diarrhea  or blood in the stool.  GU: Decreased sex drive. Denies urgency, frequency, pain with urination, burning sensation, blood in urine, odor or discharge. Musculoskeletal: Denies decrease in range of motion, difficulty with gait, muscle pain or joint pain and swelling.  Skin: Denies redness, rashes, lesions or ulcercations.  Neurological: Denies dizziness, difficulty with memory, difficulty with speech or problems with balance and coordination.  Psych: Pt reports stress. Denies anxiety, depression, SI/HI.  No other specific complaints in a complete review of systems (except as listed in HPI above).  Objective:   Physical Exam   BP 114/78   Pulse 76   Temp 98.2 F (36.8 C) (Oral)   Ht 5' 9.5" (1.765 m)   Wt 210 lb 8 oz (95.5 kg)   SpO2 98%   BMI 30.64 kg/m  Wt Readings from Last 3 Encounters:  07/12/16 210 lb 8 oz (95.5 kg)  01/12/16 217 lb (98.4 kg)  06/15/15 210 lb (95.3 kg)    General: Appears his stated age, well developed, well nourished in NAD. Skin: Warm, dry and intact.  HEENT: Head: normal shape and size; Eyes: sclera white, no icterus, conjunctiva pink, PERRLA and EOMs intact; Ears: Tm's gray and intact, normal light reflex; Throat/Mouth: Teeth present, mucosa pink and moist, no exudate, lesions or ulcerations noted.  Neck:  Neck supple, trachea midline.  No masses, lumps or thyromegaly present.  Cardiovascular: Normal rate and rhythm. S1,S2 noted.  No murmur, rubs or gallops noted. No JVD or BLE edema.  Pulmonary/Chest: Normal effort and positive vesicular breath sounds. No respiratory distress. No wheezes, rales or ronchi noted.  Abdomen: Soft and nontender. Normal bowel sounds. No distention or masses noted. Liver, spleen and kidneys non palpable. Musculoskeletal: Normal range of motion. No signs of joint swelling. Strength 5/5 BUE/BLE. No difficulty with gait.  Neurological: Alert and oriented. Cranial nerves II-XII grossly intact. Coordination normal.  Psychiatric: Mood  and affect normal. Behavior is normal. Judgment and thought content normal.    BMET    Component Value Date/Time   NA 143 01/13/2015 1524   K 4.0 01/13/2015 1524   CL 98 01/13/2015 1524   CO2 26 01/13/2015 1524   GLUCOSE 76 01/13/2015 1524   BUN 12 01/13/2015 1524   CREATININE 0.80 01/13/2015 1524   CALCIUM 9.6 01/13/2015 1524   GFRNONAA 111 01/13/2015 1524   GFRAA 128 01/13/2015 1524    Lipid Panel     Component Value Date/Time   CHOL 159 01/13/2015 1524   TRIG 66 01/13/2015 1524   HDL 55 01/13/2015 1524    CBC    Component Value Date/Time   WBC 9.3 01/13/2015 1536   RBC 5.05 01/13/2015 1536   HGB 17.0 06/15/2015 1055   HCT 48.2 01/13/2015 1536   MCV 95.5 01/13/2015 1536   MCH 31.0 01/13/2015 1536   MCHC 32.4 01/13/2015 1536    Hgb A1C No results found for: HGBA1C      Assessment & Plan:   Preventative Health Maintenance:  Flu shot today Tetanus UTD He has no family history of prostate cancer, will start routine PSA screening at age 36 Encouraged him to see an eye doctor and dentist annually Encouraged him to consume a balanced diet and exercise regimen Will check CBC, CMET, Lipid and HIV today  Decreased sex drive:  Stress related vs low testosterone Will check testosterone today Discussed that I don't do testosterone replacement but if low can refer to endocrinilogy  RTC in 1 year or sooner

## 2016-07-12 NOTE — Addendum Note (Signed)
Addended by: Lurlean Nanny on: 07/12/2016 12:14 PM   Modules accepted: Orders

## 2016-07-13 LAB — HIV ANTIBODY (ROUTINE TESTING W REFLEX): HIV: NONREACTIVE

## 2019-04-06 ENCOUNTER — Encounter: Payer: Self-pay | Admitting: Physician Assistant

## 2019-04-06 ENCOUNTER — Telehealth: Payer: BLUE CROSS/BLUE SHIELD | Admitting: Physician Assistant

## 2019-04-06 DIAGNOSIS — Z20828 Contact with and (suspected) exposure to other viral communicable diseases: Secondary | ICD-10-CM

## 2019-04-06 NOTE — Progress Notes (Signed)
E-Visit for [employer] Virus Screening For [phone] testing,  Call your health care provider or local health department to request and arrange formal testing. Many health care providers can now test patients at their office but not all are.  Please quarantine yourself while awaiting your test results.  [place] [phone], [place], [place] or visit [email or web address]     COVID-19 is a respiratory illness with symptoms that are similar to the flu. Symptoms are typically mild to moderate, but there have been cases of severe illness and death due to the virus. The following symptoms may appear 2-14 days after exposure: . Fever . Cough . Shortness of breath or difficulty breathing . Chills . Repeated shaking with chills . Muscle pain . Headache . Sore throat . New loss of taste or smell . Fatigue . Congestion or runny nose . Nausea or vomiting . Diarrhea  It is vitally important that if you feel that you have an infection such as this virus or any other virus that you stay home and away from places where you may spread it to others.  You should self-quarantine for 14 days if you have symptoms that could potentially be coronavirus or have been in close contact a with a person diagnosed with COVID-19 within the last 2 weeks. You should avoid contact with people age 14 and older.   You should wear a mask or cloth face covering over your nose and mouth if you must be around other people or animals, including pets (even at home). Try to stay at least 6 feet away from other people. This will protect the people around you.    You may also take acetaminophen (Tylenol) as needed for fever.   Reduce your risk of any infection by using the same precautions used for avoiding the common cold or flu:  Marland Kitchen Wash your hands often with soap and warm water  for at least 20 seconds.  If soap and water are not readily available, use an alcohol-based hand sanitizer with at least 60% alcohol.  . If coughing or sneezing, cover your mouth and nose by coughing or sneezing into the elbow areas of your shirt or coat, into a tissue or into your sleeve (not your hands). . Avoid shaking hands with others and consider head nods or verbal greetings only. . Avoid touching your eyes, nose, or mouth with unwashed hands.  . Avoid close contact with people who are sick. . Avoid places or events with large numbers of people in one location, like concerts or sporting events. . Carefully consider travel plans you have or are making. . If you are planning any travel outside or inside the [place], visit the CDC's Travelers' Health webpage for the latest health notices. . If you have some symptoms but not all symptoms, continue to monitor at home and seek medical attention if your symptoms worsen. . If you are having a medical emergency, call 911.  HOME CARE . Only take medications as instructed by your medical team. . Drink plenty of fluids and get plenty of rest. . A steam or ultrasonic humidifier can help if you have congestion.   GET HELP RIGHT AWAY IF YOU HAVE EMERGENCY WARNING SIGNS** FOR COVID-19. If you or someone is showing any of these signs seek emergency medical care immediately. Call 911 or proceed to your closest emergency facility if: . You develop worsening high fever. . Trouble breathing . Bluish lips or face .  Persistent pain or pressure in the chest . New confusion . Inability to wake or stay awake . You cough up blood. . Your symptoms become more severe  **This list is not all possible symptoms. Contact your medical provider for any symptoms that are sever or concerning to you.   MAKE SURE YOU   Understand these instructions.  Will watch your condition.  Will get help right away if you are not doing well or get worse.  Your e-visit answers  were reviewed by a board certified advanced clinical practitioner to complete your personal care plan.  Depending on the condition, your plan could have included both over the counter or prescription medications.  If there is a problem please reply once you have received a response from your provider.  Your safety is important to [place].  If you have drug allergies check your prescription carefully.    You can use MyChart to ask questions about today's visit, request a non-urgent call back, or ask for a work or school excuse for 24 hours related to this e-Visit. If it has been greater than 24 hours you will need to follow up with your provider, or enter a new e-Visit to address those concerns. You will get an e-mail in the next two days asking about your experience.  I hope that your e-visit has been valuable and will speed your recovery. Thank you for using e-visits.

## 2019-04-06 NOTE — Progress Notes (Signed)
E-Visit for Corona Virus Screening Due to your exposure at work, you may pursue testing by calling the provided number below.     COVID-19 is a respiratory illness with symptoms that are similar to the flu. Symptoms are typically mild to moderate, but there have been cases of severe illness and death due to the virus. The following symptoms may appear 2-14 days after exposure: . Fever . Cough . Shortness of breath or difficulty breathing . Chills . Repeated shaking with chills . Muscle pain . Headache . Sore throat . New loss of taste or smell . Fatigue . Congestion or runny nose . Nausea or vomiting . Diarrhea  It is vitally important that if you feel that you have an infection such as this virus or any other virus that you stay home and away from places where you may spread it to others.  You should self-quarantine for 14 days if you have symptoms that could potentially be coronavirus or have been in close contact a with a person diagnosed with COVID-19 within the last 2 weeks. You should avoid contact with people age 40 and older.   You should wear a mask or cloth face covering over your nose and mouth if you must be around other people or animals, including pets (even at home). Try to stay at least 6 feet away from other people. This will protect the people around you.    You may also take acetaminophen (Tylenol) as needed for fever.   Reduce your risk of any infection by using the same precautions used for avoiding the common cold or flu:  Marland Kitchen Wash your hands often with soap and warm water for at least 20 seconds.  If soap and water are not readily available, use an alcohol-based hand sanitizer with at least 60% alcohol.  . If coughing or sneezing, cover your mouth and nose by coughing or sneezing into the elbow areas of your shirt or coat, into a tissue or into your sleeve (not your hands). . Avoid shaking hands with others and consider head nods or verbal greetings only. . Avoid  touching your eyes, nose, or mouth with unwashed hands.  . Avoid close contact with people who are sick. . Avoid places or events with large numbers of people in one location, like concerts or sporting events. . Carefully consider travel plans you have or are making. . If you are planning any travel outside or inside the [place], visit the CDC's Travelers' Health webpage for the latest health notices. . If you have some symptoms but not all symptoms, continue to monitor at home and seek medical attention if your symptoms worsen. . If you are having a medical emergency, call 911.  HOME CARE . Only take medications as instructed by your medical team. . Drink plenty of fluids and get plenty of rest. . A steam or ultrasonic humidifier can help if you have congestion.   GET HELP RIGHT AWAY IF YOU HAVE EMERGENCY WARNING SIGNS** FOR COVID-19. If you or someone is showing any of these signs seek emergency medical care immediately. Call 911 or proceed to your closest emergency facility if: . You develop worsening high fever. . Trouble breathing . Bluish lips or face . Persistent pain or pressure in the chest . New confusion . Inability to wake or stay awake . You cough up blood. . Your symptoms become more severe  **This list is not all possible symptoms. Contact your medical provider for any symptoms that are sever  or concerning to you.   MAKE SURE YOU   Understand these instructions.  Will watch your condition.  Will get help right away if you are not doing well or get worse.  Your e-visit answers were reviewed by a board certified advanced clinical practitioner to complete your personal care plan.  Depending on the condition, your plan could have included both over the counter or prescription medications.  If there is a problem please reply once you have received a response from your provider.  Your safety is important to [place].  If you have drug allergies check your prescription carefully.     You can use MyChart to ask questions about today's visit, request a non-urgent call back, or ask for a work or school excuse for 24 hours related to this e-Visit. If it has been greater than 24 hours you will need to follow up with your provider, or enter a new e-Visit to address those concerns. You will get an e-mail in the next two days asking about your experience.  I hope that your e-visit has been valuable and will speed your recovery. Thank you for using e-visits.   I spent 5-10 minutes on review and completion of this note- Lacy Duverney [facility]

## 2019-04-07 ENCOUNTER — Encounter: Payer: Self-pay | Admitting: Internal Medicine

## 2019-04-07 ENCOUNTER — Other Ambulatory Visit: Payer: Self-pay

## 2019-04-07 ENCOUNTER — Telehealth (INDEPENDENT_AMBULATORY_CARE_PROVIDER_SITE_OTHER): Payer: Managed Care, Other (non HMO) | Admitting: Internal Medicine

## 2019-04-07 ENCOUNTER — Telehealth: Payer: Self-pay | Admitting: Internal Medicine

## 2019-04-07 DIAGNOSIS — R0981 Nasal congestion: Secondary | ICD-10-CM

## 2019-04-07 DIAGNOSIS — Z20828 Contact with and (suspected) exposure to other viral communicable diseases: Secondary | ICD-10-CM

## 2019-04-07 DIAGNOSIS — Z20822 Contact with and (suspected) exposure to covid-19: Secondary | ICD-10-CM

## 2019-04-07 NOTE — Patient Instructions (Signed)
Person Under Monitoring Name: Julian Tran  Location: [address] [place] [address]   Infection Prevention Recommendations for Individuals Confirmed to have, or Being Evaluated for, 2019 Novel Coronavirus (COVID-19) Infection Who Receive Care at Home  Individuals who are confirmed to have, or are being evaluated for, COVID-19 should follow the prevention steps below until a healthcare provider or local or state health department says they can return to normal activities.  Stay home except to get medical care You should restrict activities outside your home, except for getting medical care. Do not go to work, school, or public areas, and do not use public transportation or taxis.  Call ahead before visiting your doctor Before your medical appointment, call the healthcare provider and tell them that you have, or are being evaluated for, COVID-19 infection. This will help the healthcare provider's office take steps to keep other people from getting infected. Ask your healthcare provider to call the local or state health department.  Monitor your symptoms Seek prompt medical attention if your illness is worsening (e.g., difficulty breathing). Before going to your medical appointment, call the healthcare provider and tell them that you have, or are being evaluated for, COVID-19 infection. Ask your healthcare provider to call the local or state health department.  Wear a facemask You should wear a facemask that covers your nose and mouth when you are in the same room with other people and when you visit a healthcare provider. People who live with or visit you should also wear a facemask while they are in the same room with you.  Separate yourself from other people in your home As much as possible, you should stay in a different room from other people in your home. Also, you should use a separate bathroom, if available.  Avoid sharing household items You should not share  dishes, drinking glasses, cups, eating utensils, towels, bedding, or other items with other people in your home. After using these items, you should wash them thoroughly with soap and water.  Cover your coughs and sneezes Cover your mouth and nose with a tissue when you cough or sneeze, or you can cough or sneeze into your sleeve. Throw used tissues in a lined trash can, and immediately wash your hands with soap and water for at least 20 seconds or use an alcohol-based hand rub.  Wash your [facility] your hands often and thoroughly with soap and water for at least 20 seconds. You can use an alcohol-based hand sanitizer if soap and water are not available and if your hands are not visibly dirty. Avoid touching your eyes, nose, and mouth with unwashed hands.   Prevention Steps for Caregivers and Household Members of Individuals Confirmed to have, or Being Evaluated for, COVID-19 Infection Being Cared for in the Home  If you live with, or provide care at home for, a person confirmed to have, or being evaluated for, COVID-19 infection please follow these guidelines to prevent infection:  Follow healthcare provider's instructions Make sure that you understand and can help the patient follow any healthcare provider instructions for all care.  Provide for the patient's basic needs You should help the patient with basic needs in the home and provide support for getting groceries, prescriptions, and other personal needs.  Monitor the patient's symptoms If they are getting sicker, call his or her medical provider and tell them that the patient has, or is being evaluated for, COVID-19 infection. This will help the healthcare provider's office take  steps to keep other people from getting infected. Ask the healthcare provider to call the local or state health department.  Limit the number of people who have contact with the patient  If possible, have only one caregiver for the patient.  Other  household members should stay in another home or place of residence. If this is not possible, they should stay  in another room, or be separated from the patient as much as possible. Use a separate bathroom, if available.  Restrict visitors who do not have an essential need to be in the home.  Keep older adults, very young children, and other sick people away from the patient Keep older adults, very young children, and those who have compromised immune systems or chronic health conditions away from the patient. This includes people with chronic heart, lung, or kidney conditions, diabetes, and cancer.  Ensure good ventilation Make sure that shared spaces in the home have good air flow, such as from an air conditioner or an opened window, weather permitting.  Wash your hands often  Wash your hands often and thoroughly with soap and water for at least 20 seconds. You can use an alcohol based hand sanitizer if soap and water are not available and if your hands are not visibly dirty.  Avoid touching your eyes, nose, and mouth with unwashed hands.  Use disposable paper towels to dry your hands. If not available, use dedicated cloth towels and replace them when they become wet.  Wear a facemask and gloves  Wear a disposable facemask at all times in the room and gloves when you touch or have contact with the patient's blood, body fluids, and/or secretions or excretions, such as sweat, saliva, sputum, nasal mucus, vomit, urine, or feces.  Ensure the mask fits over your nose and mouth tightly, and do not touch it during use.  Throw out disposable facemasks and gloves after using them. Do not reuse.  Wash your hands immediately after removing your facemask and gloves.  If your personal clothing becomes contaminated, carefully remove clothing and launder. Wash your hands after handling contaminated clothing.  Place all used disposable facemasks, gloves, and other waste in a lined container before  disposing them with other household waste.  Remove gloves and wash your hands immediately after handling these items.  Do not share dishes, glasses, or other household items with the patient  Avoid sharing household items. You should not share dishes, drinking glasses, cups, eating utensils, towels, bedding, or other items with a patient who is confirmed to have, or being evaluated for, COVID-19 infection.  After the person uses these items, you should wash them thoroughly with soap and water.  Wash laundry thoroughly  Immediately remove and wash clothes or bedding that have blood, body fluids, and/or secretions or excretions, such as sweat, saliva, sputum, nasal mucus, vomit, urine, or feces, on them.  Wear gloves when handling laundry from the patient.  Read and follow directions on labels of laundry or clothing items and detergent. In general, wash and dry with the warmest temperatures recommended on the label.  Clean all areas the individual has used often  Clean all touchable surfaces, such as counters, tabletops, doorknobs, bathroom fixtures, toilets, phones, keyboards, tablets, and bedside tables, every day. Also, clean any surfaces that may have blood, body fluids, and/or secretions or excretions on them.  Wear gloves when cleaning surfaces the patient has come in contact with.  Use a diluted bleach solution (e.g., dilute bleach with 1 part bleach  and 10 parts water) or a household disinfectant with a label that says EPA-registered for coronaviruses. To make a bleach solution at home, add 1 tablespoon of bleach to 1 quart (4 cups) of water. For a larger supply, add  cup of bleach to 1 gallon (16 cups) of water.  Read labels of cleaning products and follow recommendations provided on product labels. Labels contain instructions for safe and effective use of the cleaning product including precautions you should take when applying the product, such as wearing gloves or eye protection  and making sure you have good ventilation during use of the product.  Remove gloves and wash hands immediately after cleaning.  Monitor yourself for signs and symptoms of illness Caregivers and household members are considered close contacts, should monitor their health, and will be asked to limit movement outside of the home to the extent possible. Follow the monitoring steps for close contacts listed on the symptom monitoring form.   ? If you have additional questions, contact your local health department or call the epidemiologist on call at [phone] (available 24/7). ? This guidance is subject to change. For the most up-to-date guidance from [facility], please refer to their website: [email or web address]

## 2019-04-07 NOTE — Telephone Encounter (Signed)
Patient scheduled for Covid-19 testing @ the [facility] at 2:00 today

## 2019-04-07 NOTE — Progress Notes (Signed)
Virtual Visit via Video Note  I connected with Julian Tran on 04/07/19 at 12:15 PM EDT by a video enabled telemedicine application and verified that I am speaking with the correct person using two identifiers.  Location: Patient: Home Provider: Office   I discussed the limitations of evaluation and management by telemedicine and the availability of in person appointments. The patient expressed understanding and agreed to proceed.  History of Present Illness:  Patient reports COVID-19 exposure.  He reports 5 people at his work have tested positive.  He did have some nasal congestion yesterday but it is better today.  He denies headache, runny nose, ear pain, sore throat, cough or shortness of breath.  He denies fever, chills or body aches.  He has not taken anything over-the-counter for his symptoms.  He does have a 45-year-old son at home with asthma.   Past Medical History:  Diagnosis Date  . Chicken pox   . Vitamin D deficiency     No current outpatient medications on file.   No current facility-administered medications for this visit.     No Known Allergies  Family History  Problem Relation Age of Onset  . Depression Mother   . Alcohol abuse Father   . Alcohol abuse Brother   . Alcohol abuse Brother   . Diabetes Neg Hx   . Heart disease Neg Hx   . Stroke Neg Hx     Social History   Socioeconomic History  . Marital status: Married    Spouse name: Not on file  . Number of children: Not on file  . Years of education: Not on file  . Highest education level: Not on file  Occupational History  . Not on file  Social Needs  . Financial resource strain: Not on file  . Food insecurity    Worry: Not on file    Inability: Not on file  . Transportation needs    Medical: Not on file    Non-medical: Not on file  Tobacco Use  . Smoking status: Never Smoker  . Smokeless tobacco: Never Used  Substance and Sexual Activity  . Alcohol use: Yes    Alcohol/week: 3.0  standard drinks    Types: 3 Standard drinks or equivalent per week    Comment: OCCASIONAL  . Drug use: No  . Sexual activity: Yes    Birth control/protection: Surgical  Lifestyle  . Physical activity    Days per week: Not on file    Minutes per session: Not on file  . Stress: Not on file  Relationships  . Social Herbalist on phone: Not on file    Gets together: Not on file    Attends religious service: Not on file    Active member of club or organization: Not on file    Attends meetings of clubs or organizations: Not on file    Relationship status: Not on file  . Intimate partner violence    Fear of current or ex partner: Not on file    Emotionally abused: Not on file    Physically abused: Not on file    Forced sexual activity: Not on file  Other Topics Concern  . Not on file  Social History Narrative  . Not on file     Constitutional: Denies fever, malaise, fatigue, headache or abrupt weight changes.  HEENT: Patient reports nasal congestion.  Denies eye pain, eye redness, ear pain, ringing in the ears, wax buildup, runny nose,  bloody  nose, or sore throat. Respiratory: Denies difficulty breathing, shortness of breath, cough or sputum production.   Cardiovascular: Denies chest pain, chest tightness, palpitations or swelling in the hands or feet.   No other specific complaints in a complete review of systems (except as listed in HPI above).   Wt Readings from Last 3 Encounters:  07/12/16 210 lb 8 oz (95.5 kg)  01/12/16 217 lb (98.4 kg)  06/15/15 210 lb (95.3 kg)    General: Appears his stated age, well developed, well nourished in NAD. HEENT: Head: normal shape and size; Eyes: sclera white and EOMs intact;  Pulmonary/Chest: Normal effort. No respiratory distress.  Neurological: Alert and oriented.    BMET    Component Value Date/Time   NA 141 07/12/2016 0954   NA 143 01/13/2015 1524   K 4.3 07/12/2016 0954   CL 105 07/12/2016 0954   CO2 32  07/12/2016 0954   GLUCOSE 97 07/12/2016 0954   BUN 13 07/12/2016 0954   BUN 12 01/13/2015 1524   CREATININE 0.87 07/12/2016 0954   CALCIUM 9.4 07/12/2016 0954   GFRNONAA 111 01/13/2015 1524   GFRAA 128 01/13/2015 1524    Lipid Panel     Component Value Date/Time   CHOL 151 07/12/2016 0954   TRIG 72.0 07/12/2016 0954   TRIG 66 01/13/2015 1524   HDL 52.30 07/12/2016 0954   HDL 55 01/13/2015 1524   CHOLHDL 3 07/12/2016 0954   VLDL 14.4 07/12/2016 0954   LDLCALC 84 07/12/2016 0954    CBC    Component Value Date/Time   WBC 6.6 07/12/2016 0954   RBC 5.06 07/12/2016 0954   HGB 16.6 07/12/2016 0954   HCT 47.2 07/12/2016 0954   PLT 197.0 07/12/2016 0954   MCV 93.2 07/12/2016 0954   MCV 95.5 01/13/2015 1536   MCH 31.0 01/13/2015 1536   MCHC 35.3 07/12/2016 0954   RDW 12.1 07/12/2016 0954    Hgb A1C No results found for: HGBA1C      Assessment and Plan: Exposure to COVID 19, Nasal Congestion:  We will have him proceed to the PED drive up test site for the Novel SARS 2 Coronavirus test Advised him to wear a mask when out and discussed the importance of handwashing He understands that he will need to self quarantine until his test results have come back He declines a work note at this time  We will follow-up after test results are back, return precautions discussed  Follow Up Instructions:    I discussed the assessment and treatment plan with the patient. The patient was provided an opportunity to ask questions and all were answered. The patient agreed with the plan and demonstrated an understanding of the instructions.   The patient was advised to call back or seek an in-person evaluation if the symptoms worsen or if the condition fails to improve as anticipated.     Webb Silversmith, NP

## 2019-04-07 NOTE — Telephone Encounter (Signed)
-----   Message from Jearld Fenton, NP sent at 04/07/2019 11:54 AM EDT ----- Nasal congestion. Needs COVID testing. Positive contact with 5+ COVID + patients. [facility]

## 2019-04-09 LAB — NOVEL CORONAVIRUS, NAA: SARS-CoV-2, NAA: NOT DETECTED

## 2019-04-14 ENCOUNTER — Encounter: Payer: Self-pay | Admitting: Internal Medicine

## 2020-01-28 ENCOUNTER — Other Ambulatory Visit: Payer: Self-pay

## 2020-02-01 ENCOUNTER — Encounter: Payer: Self-pay | Admitting: Internal Medicine

## 2020-02-01 ENCOUNTER — Ambulatory Visit (INDEPENDENT_AMBULATORY_CARE_PROVIDER_SITE_OTHER): Payer: Managed Care, Other (non HMO) | Admitting: Internal Medicine

## 2020-02-01 ENCOUNTER — Other Ambulatory Visit: Payer: Self-pay

## 2020-02-01 VITALS — BP 122/78 | HR 85 | Temp 98.0°F | Ht 69.5 in | Wt 232.0 lb

## 2020-02-01 DIAGNOSIS — Z0000 Encounter for general adult medical examination without abnormal findings: Secondary | ICD-10-CM | POA: Diagnosis not present

## 2020-02-01 DIAGNOSIS — M7711 Lateral epicondylitis, right elbow: Secondary | ICD-10-CM | POA: Diagnosis not present

## 2020-02-01 DIAGNOSIS — Z1211 Encounter for screening for malignant neoplasm of colon: Secondary | ICD-10-CM

## 2020-02-01 LAB — CBC
HCT: 45.8 % (ref 39.0–52.0)
Hemoglobin: 16.1 g/dL (ref 13.0–17.0)
MCHC: 35.2 g/dL (ref 30.0–36.0)
MCV: 94.1 fl (ref 78.0–100.0)
Platelets: 173 10*3/uL (ref 150.0–400.0)
RBC: 4.86 Mil/uL (ref 4.22–5.81)
RDW: 12.3 % (ref 11.5–15.5)
WBC: 6 10*3/uL (ref 4.0–10.5)

## 2020-02-01 LAB — COMPREHENSIVE METABOLIC PANEL
ALT: 50 U/L (ref 0–53)
AST: 31 U/L (ref 0–37)
Albumin: 4.6 g/dL (ref 3.5–5.2)
Alkaline Phosphatase: 43 U/L (ref 39–117)
BUN: 12 mg/dL (ref 6–23)
CO2: 29 mEq/L (ref 19–32)
Calcium: 9.3 mg/dL (ref 8.4–10.5)
Chloride: 102 mEq/L (ref 96–112)
Creatinine, Ser: 0.85 mg/dL (ref 0.40–1.50)
GFR: 96.94 mL/min (ref >60.00)
Glucose, Bld: 107 mg/dL — ABNORMAL HIGH (ref 70–99)
Potassium: 4.3 mEq/L (ref 3.5–5.1)
Sodium: 138 mEq/L (ref 135–145)
Total Bilirubin: 1.5 mg/dL — ABNORMAL HIGH (ref 0.2–1.2)
Total Protein: 7 g/dL (ref 6.0–8.3)

## 2020-02-01 LAB — HEMOGLOBIN A1C: Hgb A1c MFr Bld: 5.1 % (ref 4.6–6.5)

## 2020-02-01 LAB — LIPID PANEL
Cholesterol: 154 mg/dL (ref 0–200)
HDL: 44.7 mg/dL (ref >39.00)
LDL Cholesterol: 88 mg/dL (ref 0–99)
NonHDL: 109.54
Total CHOL/HDL Ratio: 3
Triglycerides: 107 mg/dL (ref 0.0–149.0)
VLDL: 21.4 mg/dL (ref 0.0–40.0)

## 2020-02-01 NOTE — Progress Notes (Addendum)
Subjective:    Patient ID: Julian Tran, male    DOB: 20-Dec-1975, 44 y.o.   MRN: [id number]  HPI  Pt presents to the clinic today for his annual exam.  Flu: 06/2019 Tetanus: 12/2014 Vision Screening: as needed Dentist: annually  Diet: He does eat meat. He consume fruits and veggies daily. He tries to avoid fried foods. He drinks mostly coffee and water. Exercise: None  Review of Systems      Past Medical History:  Diagnosis Date  . Chicken pox   . Vitamin D deficiency     No current outpatient medications on file.   No current facility-administered medications for this visit.    No Known Allergies  Family History  Problem Relation Age of Onset  . Depression Mother   . Alcohol abuse Father   . Alcohol abuse Brother   . Alcohol abuse Brother   . Diabetes Neg Hx   . Heart disease Neg Hx   . Stroke Neg Hx     Social History   Socioeconomic History  . Marital status: Married    Spouse name: Not on file  . Number of children: Not on file  . Years of education: Not on file  . Highest education level: Not on file  Occupational History  . Not on file  Tobacco Use  . Smoking status: Never Smoker  . Smokeless tobacco: Never Used  Substance and Sexual Activity  . Alcohol use: Yes    Alcohol/week: 3.0 standard drinks    Types: 3 Standard drinks or equivalent per week    Comment: OCCASIONAL  . Drug use: No  . Sexual activity: Yes    Birth control/protection: Surgical  Other Topics Concern  . Not on file  Social History Narrative  . Not on file   Social Determinants of Health   Financial Resource Strain:   . Difficulty of Paying Living Expenses:   Food Insecurity:   . Worried About Charity fundraiser in the Last Year:   . Arboriculturist in the Last Year:   Transportation Needs:   . Film/video editor (Medical):   Marland Kitchen Lack of Transportation (Non-Medical):   Physical Activity:   . Days of Exercise per Week:   . Minutes of Exercise per Session:     Stress:   . Feeling of Stress :   Social Connections:   . Frequency of Communication with Friends and Family:   . Frequency of Social Gatherings with Friends and Family:   . Attends Religious Services:   . Active Member of Clubs or Organizations:   . Attends Archivist Meetings:   Marland Kitchen Marital Status:   Intimate Partner Violence:   . Fear of Current or Ex-Partner:   . Emotionally Abused:   Marland Kitchen Physically Abused:   . Sexually Abused:      Constitutional: Denies fever, malaise, fatigue, headache or abrupt weight changes.  HEENT: Denies eye pain, eye redness, ear pain, ringing in the ears, wax buildup, runny nose, nasal congestion, bloody nose, or sore throat. Respiratory: Denies difficulty breathing, shortness of breath, cough or sputum production.   Cardiovascular: Denies chest pain, chest tightness, palpitations or swelling in the hands or feet.  Gastrointestinal: Denies abdominal pain, bloating, constipation, diarrhea or blood in the stool.  GU: Denies urgency, frequency, pain with urination, burning sensation, blood in urine, odor or discharge. Musculoskeletal: Pt reports right elbow pain. Denies decrease in range of motion, difficulty with gait, muscle pain or joint  swelling.  Skin: Denies redness, rashes, lesions or ulcercations.  Neurological: Denies dizziness, difficulty with memory, difficulty with speech or problems with balance and coordination.  Psych: Denies anxiety, depression, SI/HI.  No other specific complaints in a complete review of systems (except as listed in HPI above).  Objective:   Physical Exam  BP 122/78   Pulse 85   Temp 98 F (36.7 C) (Temporal)   Ht 5' 9.5" (1.765 m)   Wt 232 lb (105.2 kg)   SpO2 98%   BMI 33.77 kg/m   Wt Readings from Last 3 Encounters:  07/12/16 210 lb 8 oz (95.5 kg)  01/12/16 217 lb (98.4 kg)  06/15/15 210 lb (95.3 kg)    General: Appears his stated age, obese, in NAD. Skin: Warm, dry and intact. No rashes  noted. HEENT: Head: normal shape and size; Eyes: sclera white, no icterus, conjunctiva pink, PERRLA and EOMs intact; Neck:  Neck supple, trachea midline. No masses, lumps or thyromegaly present.  Cardiovascular: Normal rate and rhythm. S1,S2 noted.  No murmur, rubs or gallops noted. No JVD or BLE edema.  Pulmonary/Chest: Normal effort and positive vesicular breath sounds. No respiratory distress. No wheezes, rales or ronchi noted.  Abdomen: Soft and nontender. Normal bowel sounds. No distention or masses noted. Liver, spleen and kidneys non palpable. Musculoskeletal: Normal flexion and extension of the right elbow. Pain with palpation over the right lateral epicondyle. Strength 5/5 BUE/BLE. No difficulty with gait.  Neurological: Alert and oriented. Cranial nerves II-XII grossly intact. Coordination normal.  Psychiatric: Mood and affect normal. Behavior is normal. Judgment and thought content normal.    BMET    Component Value Date/Time   NA 141 07/12/2016 0954   NA 143 01/13/2015 1524   K 4.3 07/12/2016 0954   CL 105 07/12/2016 0954   CO2 32 07/12/2016 0954   GLUCOSE 97 07/12/2016 0954   BUN 13 07/12/2016 0954   BUN 12 01/13/2015 1524   CREATININE 0.87 07/12/2016 0954   CALCIUM 9.4 07/12/2016 0954   GFRNONAA 111 01/13/2015 1524   GFRAA 128 01/13/2015 1524    Lipid Panel     Component Value Date/Time   CHOL 151 07/12/2016 0954   TRIG 72.0 07/12/2016 0954   TRIG 66 01/13/2015 1524   HDL 52.30 07/12/2016 0954   HDL 55 01/13/2015 1524   CHOLHDL 3 07/12/2016 0954   VLDL 14.4 07/12/2016 0954   LDLCALC 84 07/12/2016 0954    CBC    Component Value Date/Time   WBC 6.6 07/12/2016 0954   RBC 5.06 07/12/2016 0954   HGB 16.6 07/12/2016 0954   HCT 47.2 07/12/2016 0954   PLT 197.0 07/12/2016 0954   MCV 93.2 07/12/2016 0954   MCV 95.5 01/13/2015 1536   MCH 31.0 01/13/2015 1536   MCHC 35.3 07/12/2016 0954   RDW 12.1 07/12/2016 0954    Hgb A1C No results found for:  HGBA1C         Assessment & Plan:   Preventative Health Maintenance:  Encouraged him to get a flu shot in the fall Tetanus UTD Referral to GI for screening colonoscopy Encouraged him to consume a balanced diet and exercise regimen Advised him to see an eye doctor and dentist annually Will check CBC, C met, lipid and A1c today  Lateral Epicondylitis:  Avoid overuse Get a tennis elbow strap to use Advised NSAID's and ice OTC  RTC in 1 year, sooner if needed Webb Silversmith, NP This visit occurred during the SARS-CoV-2 public health  emergency.  Safety protocols were in place, including screening questions prior to the visit, additional usage of staff PPE, and extensive cleaning of exam room while observing appropriate contact time as indicated for disinfecting solutions.

## 2020-02-01 NOTE — Patient Instructions (Signed)

## 2020-02-03 ENCOUNTER — Encounter: Payer: Self-pay | Admitting: Gastroenterology

## 2020-02-23 ENCOUNTER — Other Ambulatory Visit: Payer: Self-pay

## 2020-02-23 ENCOUNTER — Ambulatory Visit (AMBULATORY_SURGERY_CENTER): Payer: Self-pay | Admitting: *Deleted

## 2020-02-23 VITALS — Temp 96.2°F | Ht 69.5 in | Wt 229.8 lb

## 2020-02-23 DIAGNOSIS — Z1211 Encounter for screening for malignant neoplasm of colon: Secondary | ICD-10-CM

## 2020-02-23 MED ORDER — SUTAB 1479-225-188 MG PO TABS: 1.0000 | ORAL_TABLET | Freq: Once | ORAL | 0 refills | Status: DC

## 2020-02-23 MED ORDER — SUTAB 1479-225-188 MG PO TABS: 1.0000 | ORAL_TABLET | Freq: Once | ORAL | 1 refills | Status: AC

## 2020-02-23 NOTE — Progress Notes (Signed)
covid test 03-04-20 at [place]  Pt is aware that care partner will wait in the car during procedure; if they feel like they will be too hot or cold to wait in the car; they may wait in the 4 th floor lobby. Patient is aware to bring only one care partner. We want them to wear a mask (we do not have any that we can provide them), practice social distancing, and we will check their temperatures when they get here.  I did remind the patient that their care partner needs to stay in the parking lot the entire time and have a cell phone available, we will call them when the pt is ready for discharge. Patient will wear mask into building.   No trouble with anesthesia, difficulty with moving neck or hx/fam hx of malignant hyperthermia per pt   No egg or soy allergy  No home oxygen use   No medications for weight loss taken  emmi information given  Pt denies constipation issues   Sutab code put into RX and paper copy given to pt to show pharmacy

## 2020-02-23 NOTE — Addendum Note (Signed)
Addended by: Laverna Peace on: 02/23/2020 09:18 AM   Modules accepted: Orders

## 2020-03-02 ENCOUNTER — Encounter: Payer: Self-pay | Admitting: Gastroenterology

## 2020-03-04 ENCOUNTER — Other Ambulatory Visit (HOSPITAL_COMMUNITY): Payer: Managed Care, Other (non HMO)

## 2020-03-04 ENCOUNTER — Other Ambulatory Visit
Admission: RE | Admit: 2020-03-04 | Discharge: 2020-03-04 | Disposition: A | Discharge status: Home / Self Care | Payer: Managed Care, Other (non HMO) | Source: Ambulatory Visit | Attending: Gastroenterology | Admitting: Gastroenterology

## 2020-03-04 ENCOUNTER — Other Ambulatory Visit: Payer: Self-pay

## 2020-03-04 DIAGNOSIS — Z20822 Contact with and (suspected) exposure to covid-19: Secondary | ICD-10-CM | POA: Diagnosis not present

## 2020-03-04 DIAGNOSIS — Z01812 Encounter for preprocedural laboratory examination: Secondary | ICD-10-CM | POA: Diagnosis not present

## 2020-03-04 LAB — SARS CORONAVIRUS 2 (TAT 6-24 HRS): SARS Coronavirus 2: NEGATIVE

## 2020-03-08 ENCOUNTER — Other Ambulatory Visit: Payer: Self-pay

## 2020-03-08 ENCOUNTER — Encounter: Payer: Self-pay | Admitting: Gastroenterology

## 2020-03-08 ENCOUNTER — Ambulatory Visit (AMBULATORY_SURGERY_CENTER): Payer: Managed Care, Other (non HMO) | Admitting: Gastroenterology

## 2020-03-08 VITALS — BP 125/79 | HR 50 | Temp 98.2°F | Resp 11 | Ht 69.0 in | Wt 229.0 lb

## 2020-03-08 DIAGNOSIS — D123 Benign neoplasm of transverse colon: Secondary | ICD-10-CM | POA: Diagnosis not present

## 2020-03-08 DIAGNOSIS — K635 Polyp of colon: Secondary | ICD-10-CM

## 2020-03-08 DIAGNOSIS — Z1211 Encounter for screening for malignant neoplasm of colon: Secondary | ICD-10-CM

## 2020-03-08 DIAGNOSIS — D122 Benign neoplasm of ascending colon: Secondary | ICD-10-CM | POA: Diagnosis not present

## 2020-03-08 DIAGNOSIS — D125 Benign neoplasm of sigmoid colon: Secondary | ICD-10-CM

## 2020-03-08 MED ORDER — SODIUM CHLORIDE 0.9 % IV SOLN
500.0000 mL | Freq: Once | INTRAVENOUS | Status: DC
Start: 2020-03-08 — End: 2022-02-20

## 2020-03-08 NOTE — Patient Instructions (Signed)
YOU HAD AN ENDOSCOPIC PROCEDURE TODAY AT [facility]:   Refer to the procedure report that was given to you for any specific questions about what was found during the examination.  If the procedure report does not answer your questions, please call your gastroenterologist to clarify.  If you requested that your care partner not be given the details of your procedure findings, then the procedure report has been included in a sealed envelope for you to review at your convenience later.  YOU SHOULD EXPECT: Some feelings of bloating in the abdomen. Passage of more gas than usual.  Walking can help get rid of the air that was put into your GI tract during the procedure and reduce the bloating. If you had a lower endoscopy (such as a colonoscopy or flexible sigmoidoscopy) you may notice spotting of blood in your stool or on the toilet paper. If you underwent a bowel prep for your procedure, you may not have a normal bowel movement for a few days.  Please Note:  You might notice some irritation and congestion in your nose or some drainage.  This is from the oxygen used during your procedure.  There is no need for concern and it should clear up in a day or so.  SYMPTOMS TO REPORT IMMEDIATELY:   Following lower endoscopy (colonoscopy or flexible sigmoidoscopy):  Excessive amounts of blood in the stool  Significant tenderness or worsening of abdominal pains  Swelling of the abdomen that is new, acute  Fever of 100F or higher  For urgent or emergent issues, a gastroenterologist can be reached at any hour by calling [phone]. Do not use MyChart messaging for urgent concerns.   DIET:  We do recommend a small meal at first, but then you may proceed to your regular diet.  Drink plenty of fluids but you should avoid alcoholic beverages for 24 hours.  ACTIVITY:  You should plan to take it easy for the rest of today and you should NOT DRIVE or use heavy machinery until tomorrow (because of  the sedation medicines used during the test).    FOLLOW UP: Our staff will call the number listed on your records 48-72 hours following your procedure to check on you and address any questions or concerns that you may have regarding the information given to you following your procedure. If we do not reach you, we will leave a message.  We will attempt to reach you two times.  During this call, we will ask if you have developed any symptoms of COVID 19. If you develop any symptoms (ie: fever, flu-like symptoms, shortness of breath, cough etc.) before then, please call [phone].  If you test positive for Covid 19 in the 2 weeks post procedure, please call and report this information to [place].    If any biopsies were taken you will be contacted by phone or by letter within the next 1-3 weeks.  Please call us at [phone] if you have not heard about the biopsies in 3 weeks.    SIGNATURES/CONFIDENTIALITY: You and/or your care partner have signed paperwork which will be entered into your electronic medical record.  These signatures attest to the fact that that the information above on your After Visit Summary has been reviewed and is understood.  Full responsibility of the confidentiality of this discharge information lies with you and/or your care-partner.  Await pathology- next colonoscopy in 3-5 years  Please continue your normal medications  Please read over handouts about  polyps, hemorrhoids and high fiber diets

## 2020-03-08 NOTE — Op Note (Signed)
[place] Patient Name: Julian Tran Procedure Date: 03/08/2020 9:32 AM MRN: [id number] Endoscopist: Mauri Pole , MD Age: 44 Referring MD:  Date of Birth: 29-Aug-1976 Gender: Male Account #: [id number] Procedure:                Colonoscopy Indications:              Screening for colorectal malignant neoplasm Medicines:                Monitored Anesthesia Care Procedure:                Pre-Anesthesia Assessment:                           - Prior to the procedure, a History and Physical                            was performed, and patient medications and                            allergies were reviewed. The patient's tolerance of                            previous anesthesia was also reviewed. The risks                            and benefits of the procedure and the sedation                            options and risks were discussed with the patient.                            All questions were answered, and informed consent                            was obtained. Prior Anticoagulants: The patient has                            taken no previous anticoagulant or antiplatelet                            agents. ASA Grade Assessment: II - A patient with                            mild systemic disease. After reviewing the risks                            and benefits, the patient was deemed in                            satisfactory condition to undergo the procedure.                           After obtaining informed consent, the colonoscope  was passed under direct vision. Throughout the                            procedure, the patient's blood pressure, pulse, and                            oxygen saturations were monitored continuously. The                            Colonoscope was introduced through the anus and                            advanced to the the cecum, identified by                            appendiceal orifice  and ileocecal valve. The                            colonoscopy was performed without difficulty. The                            patient tolerated the procedure well. The quality                            of the bowel preparation was good. The ileocecal                            valve, appendiceal orifice, and rectum were                            photographed. Scope In: 9:41:18 AM Scope Out: 10:00:51 AM Scope Withdrawal Time: 0 hours 17 minutes 59 seconds  Total Procedure Duration: 0 hours 19 minutes 33 seconds  Findings:                 The perianal and digital rectal examinations were                            normal.                           A 5 mm polyp was found in the ascending colon. The                            polyp was sessile. The polyp was removed with a                            cold snare. Resection and retrieval were complete.                           Three sessile polyps were found in the sigmoid                            colon, transverse colon and ascending colon. The  polyps were 1 to 2 mm in size. These polyps were                            removed with a cold biopsy forceps. Resection and                            retrieval were complete.                           Non-bleeding internal hemorrhoids were found during                            retroflexion. The hemorrhoids were small. Complications:            No immediate complications. Estimated Blood Loss:     Estimated blood loss was minimal. Impression:               - One 5 mm polyp in the ascending colon, removed                            with a cold snare. Resected and retrieved.                           - Three 1 to 2 mm polyps in the sigmoid colon, in                            the transverse colon and in the ascending colon,                            removed with a cold biopsy forceps. Resected and                            retrieved.                           -  Non-bleeding internal hemorrhoids. Recommendation:           - Patient has a contact number available for                            emergencies. The signs and symptoms of potential                            delayed complications were discussed with the                            patient. Return to normal activities tomorrow.                            Written discharge instructions were provided to the                            patient.                           -  Resume previous diet.                           - Continue present medications.                           - Await pathology results.                           - Repeat colonoscopy in 3 - 5 years for                            surveillance based on pathology results. Mauri Pole, MD 03/08/2020 10:08:43 AM This report has been signed electronically.

## 2020-03-08 NOTE — Progress Notes (Signed)
Pt's states no medical or surgical changes since previsit or office visit. 

## 2020-03-08 NOTE — Progress Notes (Signed)
A/ox3, pleased with MAC, report to RN 

## 2020-03-08 NOTE — Progress Notes (Signed)
Called to room to assist during endoscopic procedure.  Patient ID and intended procedure confirmed with present staff. Received instructions for my participation in the procedure from the performing physician.  

## 2020-03-10 ENCOUNTER — Telehealth: Payer: Self-pay

## 2020-03-10 ENCOUNTER — Encounter: Payer: Self-pay | Admitting: Gastroenterology

## 2020-03-10 NOTE — Telephone Encounter (Signed)
  Follow up Call-  Call back number 03/08/2020  Post procedure Call Back phone  # [phone]  Permission to leave phone message Yes  Some recent data might be hidden     Patient questions:  Do you have a fever, pain , or abdominal swelling? No. Pain Score  0 *  Have you tolerated food without any problems? Yes.    Have you been able to return to your normal activities? Yes.    Do you have any questions about your discharge instructions: Diet   No. Medications  No. Follow up visit  No.  Do you have questions or concerns about your Care? No.  Actions: * If pain score is 4 or above: No action needed, pain <4.  1. Have you developed a fever since your procedure? no  2.   Have you had an respiratory symptoms (SOB or cough) since your procedure? no  3.   Have you tested positive for COVID 19 since your procedure no  4.   Have you had any family members/close contacts diagnosed with the COVID 19 since your procedure?  no   If yes to any of these questions please route to Joylene John, RN and Erenest Rasher, RN

## 2021-01-05 ENCOUNTER — Encounter: Payer: Self-pay | Admitting: Internal Medicine

## 2021-09-11 DIAGNOSIS — J101 Influenza due to other identified influenza virus with other respiratory manifestations: Secondary | ICD-10-CM | POA: Diagnosis not present

## 2021-09-11 DIAGNOSIS — Z6830 Body mass index (BMI) 30.0-30.9, adult: Secondary | ICD-10-CM | POA: Diagnosis not present

## 2021-09-11 DIAGNOSIS — R509 Fever, unspecified: Secondary | ICD-10-CM | POA: Diagnosis not present

## 2021-09-11 DIAGNOSIS — R059 Cough, unspecified: Secondary | ICD-10-CM | POA: Diagnosis not present

## 2022-02-20 ENCOUNTER — Ambulatory Visit (INDEPENDENT_AMBULATORY_CARE_PROVIDER_SITE_OTHER): Payer: BC Managed Care – PPO | Admitting: Internal Medicine

## 2022-02-20 ENCOUNTER — Encounter: Payer: Self-pay | Admitting: Internal Medicine

## 2022-02-20 VITALS — BP 114/82 | HR 73 | Temp 97.3°F | Ht 70.0 in | Wt 225.0 lb

## 2022-02-20 DIAGNOSIS — Z0001 Encounter for general adult medical examination with abnormal findings: Secondary | ICD-10-CM | POA: Diagnosis not present

## 2022-02-20 DIAGNOSIS — Z6832 Body mass index (BMI) 32.0-32.9, adult: Secondary | ICD-10-CM

## 2022-02-20 DIAGNOSIS — E66811 Obesity, class 1: Secondary | ICD-10-CM

## 2022-02-20 DIAGNOSIS — E6609 Other obesity due to excess calories: Secondary | ICD-10-CM | POA: Diagnosis not present

## 2022-02-20 DIAGNOSIS — Z1159 Encounter for screening for other viral diseases: Secondary | ICD-10-CM | POA: Diagnosis not present

## 2022-02-20 NOTE — Patient Instructions (Signed)
Health Maintenance, Male Adopting a healthy lifestyle and getting preventive care are important in promoting health and wellness. Ask your health care provider about: The right schedule for you to have regular tests and exams. Things you can do on your own to prevent diseases and keep yourself healthy. What should I know about diet, weight, and exercise? Eat a healthy diet  Eat a diet that includes plenty of vegetables, fruits, low-fat dairy products, and lean protein. Do not eat a lot of foods that are high in solid fats, added sugars, or sodium. Maintain a healthy weight Body mass index (BMI) is a measurement that can be used to identify possible weight problems. It estimates body fat based on height and weight. Your health care provider can help determine your BMI and help you achieve or maintain a healthy weight. Get regular exercise Get regular exercise. This is one of the most important things you can do for your health. Most adults should: Exercise for at least 150 minutes each week. The exercise should increase your heart rate and make you sweat (moderate-intensity exercise). Do strengthening exercises at least twice a week. This is in addition to the moderate-intensity exercise. Spend less time sitting. Even light physical activity can be beneficial. Watch cholesterol and blood lipids Have your blood tested for lipids and cholesterol at 48 years of age, then have this test every 5 years. You may need to have your cholesterol levels checked more often if: Your lipid or cholesterol levels are high. You are older than 48 years of age. You are at high risk for heart disease. What should I know about cancer screening? Many types of cancers can be detected early and may often be prevented. Depending on your health history and family history, you may need to have cancer screening at various ages. This may include screening for: Colorectal cancer. Prostate cancer. Skin cancer. Lung  cancer. What should I know about heart disease, diabetes, and high blood pressure? Blood pressure and heart disease High blood pressure causes heart disease and increases the risk of stroke. This is more likely to develop in people who have high blood pressure readings or are overweight. Talk with your health care provider about your target blood pressure readings. Have your blood pressure checked: Every 3-5 years if you are 18-39 years of age. Every year if you are 40 years old or older. If you are between the ages of 65 and 75 and are a current or former smoker, ask your health care provider if you should have a one-time screening for abdominal aortic aneurysm (AAA). Diabetes Have regular diabetes screenings. This checks your fasting blood sugar level. Have the screening done: Once every three years after age 45 if you are at a normal weight and have a low risk for diabetes. More often and at a younger age if you are overweight or have a high risk for diabetes. What should I know about preventing infection? Hepatitis B If you have a higher risk for hepatitis B, you should be screened for this virus. Talk with your health care provider to find out if you are at risk for hepatitis B infection. Hepatitis C Blood testing is recommended for: Everyone born from 1945 through 1965. Anyone with known risk factors for hepatitis C. Sexually transmitted infections (STIs) You should be screened each year for STIs, including gonorrhea and chlamydia, if: You are sexually active and are younger than 48 years of age. You are older than 48 years of age and your   health care provider tells you that you are at risk for this type of infection. Your sexual activity has changed since you were last screened, and you are at increased risk for chlamydia or gonorrhea. Ask your health care provider if you are at risk. Ask your health care provider about whether you are at high risk for HIV. Your health care provider  may recommend a prescription medicine to help prevent HIV infection. If you choose to take medicine to prevent HIV, you should first get tested for HIV. You should then be tested every 3 months for as long as you are taking the medicine. Follow these instructions at home: Alcohol use Do not drink alcohol if your health care provider tells you not to drink. If you drink alcohol: Limit how much you have to 0-2 drinks a day. Know how much alcohol is in your drink. In the [place] one drink equals one 12 oz bottle of beer (355 mL), one 5 oz glass of wine (148 mL), or one 1 oz glass of hard liquor (44 mL). Lifestyle Do not use any products that contain nicotine or tobacco. These products include cigarettes, chewing tobacco, and vaping devices, such as e-cigarettes. If you need help quitting, ask your health care provider. Do not use street drugs. Do not share needles. Ask your health care provider for help if you need support or information about quitting drugs. General instructions Schedule regular health, dental, and eye exams. Stay current with your vaccines. Tell your health care provider if: You often feel depressed. You have ever been abused or do not feel safe at home. Summary Adopting a healthy lifestyle and getting preventive care are important in promoting health and wellness. Follow your health care provider's instructions about healthy diet, exercising, and getting tested or screened for diseases. Follow your health care provider's instructions on monitoring your cholesterol and blood pressure. This information is not intended to replace advice given to you by your health care provider. Make sure you discuss any questions you have with your health care provider. Document Revised: 02/27/2021 Document Reviewed: 02/27/2021 Elsevier Patient Education  2023 [employer] Inc.  

## 2022-02-20 NOTE — Assessment & Plan Note (Signed)
Encourage diet and exercise for weight loss 

## 2022-02-20 NOTE — Progress Notes (Signed)
? ?Subjective:  ? ? Patient ID: Julian Tran, male    DOB: 1976-06-28, 46 y.o.   MRN: [id number] ? ?HPI ? ?Patient presents to clinic today for his annual exam. ? ?Flu: 06/2019 ?Tetanus: 12/2014 ?COVID: never ?Colon screening: 02/2020 ?Vision screening: annually ?Dentist: biannually ? ?Diet: He does eat meat. He does consumes fruits and veggies. He does eat fried foods. He drinks mostly water, coffee, beer. ?Exercise: None, cough to 5K ? ?Review of Systems ? ?   ?Past Medical History:  ?Diagnosis Date  ? Allergy   ? Chicken pox   ? Vitamin D deficiency   ? ? ?Current Outpatient Medications  ?Medication Sig Dispense Refill  ? Aspirin-Acetaminophen-Caffeine (EXCEDRIN PO) Take by mouth. PRN    ? fexofenadine (ALLEGRA ALLERGY) 180 MG tablet PRN    ? ?Current Facility-Administered Medications  ?Medication Dose Route Frequency Provider Last Rate Last Admin  ? 0.9 %  sodium chloride infusion  500 mL Intravenous Once Nandigam, Venia Minks, MD      ? ? ?No Known Allergies ? ?Family History  ?Problem Relation Age of Onset  ? Depression Mother   ? Alcohol abuse Father   ? Alcohol abuse Brother   ? Alcohol abuse Brother   ? Diabetes Neg Hx   ? Heart disease Neg Hx   ? Stroke Neg Hx   ? Colon cancer Neg Hx   ? Esophageal cancer Neg Hx   ? Rectal cancer Neg Hx   ? Stomach cancer Neg Hx   ? ? ?Social History  ? ?Socioeconomic History  ? Marital status: Married  ?  Spouse name: Not on file  ? Number of children: Not on file  ? Years of education: Not on file  ? Highest education level: Not on file  ?Occupational History  ? Not on file  ?Tobacco Use  ? Smoking status: Never  ? Smokeless tobacco: Never  ?Vaping Use  ? Vaping Use: Never used  ?Substance and Sexual Activity  ? Alcohol use: Yes  ?  Alcohol/week: 3.0 standard drinks  ?  Types: 3 Standard drinks or equivalent per week  ?  Comment: OCCASIONAL  ? Drug use: No  ? Sexual activity: Yes  ?  Birth control/protection: Surgical  ?Other Topics Concern  ? Not on file  ?Social History  Narrative  ? Not on file  ? ?Social Determinants of Health  ? ?Financial Resource Strain: Not on file  ?Food Insecurity: Not on file  ?Transportation Needs: Not on file  ?Physical Activity: Not on file  ?Stress: Not on file  ?Social Connections: Not on file  ?Intimate Partner Violence: Not on file  ? ? ? ?Constitutional: Denies fever, malaise, fatigue, headache or abrupt weight changes.  ?HEENT: Denies eye pain, eye redness, ear pain, ringing in the ears, wax buildup, runny nose, nasal congestion, bloody nose, or sore throat. ?Respiratory: Denies difficulty breathing, shortness of breath, cough or sputum production.   ?Cardiovascular: Denies chest pain, chest tightness, palpitations or swelling in the hands or feet.  ?Gastrointestinal: Denies abdominal pain, bloating, constipation, diarrhea or blood in the stool.  ?GU: Denies urgency, frequency, pain with urination, burning sensation, blood in urine, odor or discharge. ?Musculoskeletal: Patient reports intermittent low back pain.  Denies decrease in range of motion, difficulty with gait, or joint swelling.  ?Skin: Denies redness, rashes, lesions or ulcercations.  ?Neurological: Denies dizziness, difficulty with memory, difficulty with speech or problems with balance and coordination.  ?Psych: Denies anxiety, depression, SI/HI. ? ?No other  specific complaints in a complete review of systems (except as listed in HPI above). ? ?Objective:  ? Physical Exam ? ?BP 114/82 (BP Location: Left Arm, Patient Position: Sitting, Cuff Size: Large)   Pulse 73   Temp (!) 97.3 ?F (36.3 ?C) (Temporal)   Ht 5' 10"  (1.778 m)   Wt 225 lb (102.1 kg)   SpO2 99%   BMI 32.28 kg/m?  ? ?Wt Readings from Last 3 Encounters:  ?03/08/20 229 lb (103.9 kg)  ?02/23/20 229 lb 12.8 oz (104.2 kg)  ?02/01/20 232 lb (105.2 kg)  ? ? ?General: Appears his stated age, obese, in NAD. ?Skin: Warm, dry and intact.  ?HEENT: Head: normal shape and size; Eyes: sclera white, no icterus, conjunctiva pink,  PERRLA and EOMs intact;  ?Neck:  Neck supple, trachea midline. No masses, lumps or thyromegaly present.  ?Cardiovascular: Normal rate and rhythm. S1,S2 noted.  No murmur, rubs or gallops noted. No JVD or BLE edema. No carotid bruits noted. ?Pulmonary/Chest: Normal effort and positive vesicular breath sounds. No respiratory distress. No wheezes, rales or ronchi noted.  ?Abdomen: Soft and nontender. Normal bowel sounds.  ?Musculoskeletal: Strength 5/5 BUE/BLE.  No difficulty with gait.  ?Neurological: Alert and oriented. Cranial nerves II-XII grossly intact. Coordination normal.  ?Psychiatric: Mood and affect normal. Behavior is normal. Judgment and thought content normal.  ? ? ? ?BMET ?   ?Component Value Date/Time  ? NA 138 02/01/2020 0855  ? NA 143 01/13/2015 1524  ? K 4.3 02/01/2020 0855  ? CL 102 02/01/2020 0855  ? CO2 29 02/01/2020 0855  ? GLUCOSE 107 (H) 02/01/2020 0855  ? BUN 12 02/01/2020 0855  ? BUN 12 01/13/2015 1524  ? CREATININE 0.85 02/01/2020 0855  ? CALCIUM 9.3 02/01/2020 0855  ? [facility] 111 01/13/2015 1524  ? GFRAA 128 01/13/2015 1524  ? ? ?Lipid Panel  ?   ?Component Value Date/Time  ? CHOL 154 02/01/2020 0855  ? TRIG 107.0 02/01/2020 0855  ? TRIG 66 01/13/2015 1524  ? HDL 44.70 02/01/2020 0855  ? HDL 55 01/13/2015 1524  ? CHOLHDL 3 02/01/2020 0855  ? VLDL 21.4 02/01/2020 0855  ? [place] 88 02/01/2020 0855  ? ? ?CBC ?   ?Component Value Date/Time  ? WBC 6.0 02/01/2020 0855  ? RBC 4.86 02/01/2020 0855  ? HGB 16.1 02/01/2020 0855  ? HCT 45.8 02/01/2020 0855  ? PLT 173.0 02/01/2020 0855  ? MCV 94.1 02/01/2020 0855  ? MCV 95.5 01/13/2015 1536  ? MCH 31.0 01/13/2015 1536  ? MCHC 35.2 02/01/2020 0855  ? RDW 12.3 02/01/2020 0855  ? ? ?Hgb A1C ?Lab Results  ?Component Value Date  ? HGBA1C 5.1 02/01/2020  ? ? ? ? ? ? ?   ?Assessment & Plan:  ? ?Preventative Health Maintenance: ? ?Encouraged him to get a flu shot the fall ?Tetanus UTD ?Encouraged him to get a COVID-vaccine ?Colon screening UTD ?Encouraged him  to consume a balanced diet and exercise regimen ?Advised him to see an eye doctor and dentist annually ?We will check CBC, c-Met, lipid, A1c and hep C today ? ?RTC in 1 year, sooner if needed ? ?Webb Silversmith, NP ? ?

## 2022-02-21 LAB — COMPLETE METABOLIC PANEL WITH GFR
AG Ratio: 1.8 (calc) (ref 1.0–2.5)
ALT: 38 U/L (ref 9–46)
AST: 24 U/L (ref 10–40)
Albumin: 4.5 g/dL (ref 3.6–5.1)
Alkaline phosphatase (APISO): 44 U/L (ref 36–130)
BUN: 19 mg/dL (ref 7–25)
CO2: 29 mmol/L (ref 20–32)
Calcium: 9.3 mg/dL (ref 8.6–10.3)
Chloride: 107 mmol/L (ref 98–110)
Creat: 0.74 mg/dL (ref 0.60–1.29)
Globulin: 2.5 g/dL (calc) (ref 1.9–3.7)
Glucose, Bld: 107 mg/dL — ABNORMAL HIGH (ref 65–99)
Potassium: 4.4 mmol/L (ref 3.5–5.3)
Sodium: 143 mmol/L (ref 135–146)
Total Bilirubin: 0.8 mg/dL (ref 0.2–1.2)
Total Protein: 7 g/dL (ref 6.1–8.1)
eGFR: 112 mL/min/{1.73_m2} (ref >=60)

## 2022-02-21 LAB — CBC
HCT: 48.1 % (ref 38.5–50.0)
Hemoglobin: 16.9 g/dL (ref 13.2–17.1)
MCH: 33.1 pg — ABNORMAL HIGH (ref 27.0–33.0)
MCHC: 35.1 g/dL (ref 32.0–36.0)
MCV: 94.1 fL (ref 80.0–100.0)
MPV: 11.2 fL (ref 7.5–12.5)
Platelets: 200 10*3/uL (ref 140–400)
RBC: 5.11 10*6/uL (ref 4.20–5.80)
RDW: 12.1 % (ref 11.0–15.0)
WBC: 7.6 10*3/uL (ref 3.8–10.8)

## 2022-02-21 LAB — LIPID PANEL
Cholesterol: 160 mg/dL (ref <200)
HDL: 53 mg/dL (ref >=40)
LDL Cholesterol (Calc): 92 mg/dL (calc)
Non-HDL Cholesterol (Calc): 107 mg/dL (calc) (ref <130)
Total CHOL/HDL Ratio: 3 (calc) (ref <5.0)
Triglycerides: 66 mg/dL (ref <150)

## 2022-02-21 LAB — HEMOGLOBIN A1C
Hgb A1c MFr Bld: 5.2 % of total Hgb (ref <5.7)
Mean Plasma Glucose: 103 mg/dL
eAG (mmol/L): 5.7 mmol/L

## 2022-02-21 LAB — HEPATITIS C ANTIBODY
Hepatitis C Ab: NONREACTIVE
SIGNAL TO CUT-OFF: 0.09 (ref <1.00)

## 2023-11-17 ENCOUNTER — Encounter: Payer: Self-pay | Admitting: Gastroenterology

## 2023-11-18 DIAGNOSIS — Z20822 Contact with and (suspected) exposure to covid-19: Secondary | ICD-10-CM | POA: Diagnosis not present

## 2023-11-18 DIAGNOSIS — J329 Chronic sinusitis, unspecified: Secondary | ICD-10-CM | POA: Diagnosis not present

## 2023-11-18 DIAGNOSIS — R070 Pain in throat: Secondary | ICD-10-CM | POA: Diagnosis not present

## 2023-11-18 DIAGNOSIS — R059 Cough, unspecified: Secondary | ICD-10-CM | POA: Diagnosis not present

## 2024-07-10 DIAGNOSIS — H35361 Drusen (degenerative) of macula, right eye: Secondary | ICD-10-CM | POA: Diagnosis not present

## 2024-08-11 ENCOUNTER — Ambulatory Visit: Payer: Self-pay | Admitting: Internal Medicine

## 2024-08-11 ENCOUNTER — Ambulatory Visit: Admitting: Internal Medicine

## 2024-08-11 VITALS — BP 124/88 | Ht 70.0 in | Wt 238.0 lb

## 2024-08-11 DIAGNOSIS — Z125 Encounter for screening for malignant neoplasm of prostate: Secondary | ICD-10-CM

## 2024-08-11 DIAGNOSIS — G4719 Other hypersomnia: Secondary | ICD-10-CM

## 2024-08-11 DIAGNOSIS — R0681 Apnea, not elsewhere classified: Secondary | ICD-10-CM | POA: Diagnosis not present

## 2024-08-11 DIAGNOSIS — R350 Frequency of micturition: Secondary | ICD-10-CM | POA: Diagnosis not present

## 2024-08-11 DIAGNOSIS — E66811 Obesity, class 1: Secondary | ICD-10-CM

## 2024-08-11 DIAGNOSIS — Z0001 Encounter for general adult medical examination with abnormal findings: Secondary | ICD-10-CM | POA: Diagnosis not present

## 2024-08-11 DIAGNOSIS — R0683 Snoring: Secondary | ICD-10-CM | POA: Diagnosis not present

## 2024-08-11 DIAGNOSIS — Z1211 Encounter for screening for malignant neoplasm of colon: Secondary | ICD-10-CM

## 2024-08-11 DIAGNOSIS — Z23 Encounter for immunization: Secondary | ICD-10-CM

## 2024-08-11 DIAGNOSIS — Z136 Encounter for screening for cardiovascular disorders: Secondary | ICD-10-CM

## 2024-08-11 DIAGNOSIS — R739 Hyperglycemia, unspecified: Secondary | ICD-10-CM

## 2024-08-11 DIAGNOSIS — E6609 Other obesity due to excess calories: Secondary | ICD-10-CM

## 2024-08-11 DIAGNOSIS — Z6834 Body mass index (BMI) 34.0-34.9, adult: Secondary | ICD-10-CM

## 2024-08-11 LAB — CBC
HCT: 48.2 % (ref 38.5–50.0)
Hemoglobin: 16.8 g/dL (ref 13.2–17.1)
MCH: 33.4 pg — ABNORMAL HIGH (ref 27.0–33.0)
MCHC: 34.9 g/dL (ref 32.0–36.0)
MCV: 95.8 fL (ref 80.0–100.0)
MPV: 11.1 fL (ref 7.5–12.5)
Platelets: 208 Thousand/uL (ref 140–400)
RBC: 5.03 Million/uL (ref 4.20–5.80)
RDW: 12.1 % (ref 11.0–15.0)
WBC: 7.2 Thousand/uL (ref 3.8–10.8)

## 2024-08-11 LAB — LIPID PANEL
Cholesterol: 165 mg/dL (ref <200)
HDL: 55 mg/dL (ref >=40)
LDL Cholesterol (Calc): 89 mg/dL
Non-HDL Cholesterol (Calc): 110 mg/dL (ref <130)
Total CHOL/HDL Ratio: 3 (calc) (ref <5.0)
Triglycerides: 112 mg/dL (ref <150)

## 2024-08-11 LAB — COMPREHENSIVE METABOLIC PANEL WITH GFR
AG Ratio: 2 (calc) (ref 1.0–2.5)
ALT: 83 U/L — ABNORMAL HIGH (ref 9–46)
AST: 51 U/L — ABNORMAL HIGH (ref 10–35)
Albumin: 4.9 g/dL (ref 3.6–5.1)
Alkaline phosphatase (APISO): 42 U/L (ref 35–144)
BUN: 10 mg/dL (ref 7–25)
CO2: 31 mmol/L (ref 20–32)
Calcium: 9.8 mg/dL (ref 8.6–10.3)
Chloride: 101 mmol/L (ref 98–110)
Creat: 0.88 mg/dL (ref 0.70–1.30)
Globulin: 2.5 g/dL (ref 1.9–3.7)
Glucose, Bld: 112 mg/dL — ABNORMAL HIGH (ref 65–99)
Potassium: 4.9 mmol/L (ref 3.5–5.3)
Sodium: 141 mmol/L (ref 135–146)
Total Bilirubin: 1.3 mg/dL — ABNORMAL HIGH (ref 0.2–1.2)
Total Protein: 7.4 g/dL (ref 6.1–8.1)
eGFR: 105 mL/min/1.73m2 (ref >=60)

## 2024-08-11 LAB — POCT URINE DIPSTICK
Bilirubin, UA: NEGATIVE
Blood, UA: NEGATIVE
Glucose, UA: NEGATIVE mg/dL
Ketones, POC UA: NEGATIVE mg/dL
Leukocytes, UA: NEGATIVE
Nitrite, UA: NEGATIVE
POC PROTEIN,UA: NEGATIVE
Spec Grav, UA: <=1.005 — AB (ref 1.010–1.025)
Urobilinogen, UA: 0.2 U/dL
pH, UA: 7.5 (ref 5.0–8.0)

## 2024-08-11 LAB — PSA: PSA: 1.07 ng/mL (ref <=4.00)

## 2024-08-11 LAB — HEMOGLOBIN A1C
Hgb A1c MFr Bld: 5.4 % (ref <5.7)
Mean Plasma Glucose: 108 mg/dL
eAG (mmol/L): 6 mmol/L

## 2024-08-11 NOTE — Patient Instructions (Signed)
Health Maintenance, Male Adopting a healthy lifestyle and getting preventive care are important in promoting health and wellness. Ask your health care provider about: The right schedule for you to have regular tests and exams. Things you can do on your own to prevent diseases and keep yourself healthy. What should I know about diet, weight, and exercise? Eat a healthy diet  Eat a diet that includes plenty of vegetables, fruits, low-fat dairy products, and lean protein. Do not eat a lot of foods that are high in solid fats, added sugars, or sodium. Maintain a healthy weight Body mass index (BMI) is a measurement that can be used to identify possible weight problems. It estimates body fat based on height and weight. Your health care provider can help determine your BMI and help you achieve or maintain a healthy weight. Get regular exercise Get regular exercise. This is one of the most important things you can do for your health. Most adults should: Exercise for at least 150 minutes each week. The exercise should increase your heart rate and make you sweat (moderate-intensity exercise). Do strengthening exercises at least twice a week. This is in addition to the moderate-intensity exercise. Spend less time sitting. Even light physical activity can be beneficial. Watch cholesterol and blood lipids Have your blood tested for lipids and cholesterol at 50 years of age, then have this test every 5 years. You may need to have your cholesterol levels checked more often if: Your lipid or cholesterol levels are high. You are older than 50 years of age. You are at high risk for heart disease. What should I know about cancer screening? Many types of cancers can be detected early and may often be prevented. Depending on your health history and family history, you may need to have cancer screening at various ages. This may include screening for: Colorectal cancer. Prostate cancer. Skin cancer. Lung  cancer. What should I know about heart disease, diabetes, and high blood pressure? Blood pressure and heart disease High blood pressure causes heart disease and increases the risk of stroke. This is more likely to develop in people who have high blood pressure readings or are overweight. Talk with your health care provider about your target blood pressure readings. Have your blood pressure checked: Every 3-5 years if you are 85-68 years of age. Every year if you are 1 years old or older. If you are between the ages of 30 and 36 and are a current or former smoker, ask your health care provider if you should have a one-time screening for abdominal aortic aneurysm (AAA). Diabetes Have regular diabetes screenings. This checks your fasting blood sugar level. Have the screening done: Once every three years after age 58 if you are at a normal weight and have a low risk for diabetes. More often and at a younger age if you are overweight or have a high risk for diabetes. What should I know about preventing infection? Hepatitis B If you have a higher risk for hepatitis B, you should be screened for this virus. Talk with your health care provider to find out if you are at risk for hepatitis B infection. Hepatitis C Blood testing is recommended for: Everyone born from [birth year] through 1967. Anyone with known risk factors for hepatitis C. Sexually transmitted infections (STIs) You should be screened each year for STIs, including gonorrhea and chlamydia, if: You are sexually active and are younger than 50 years of age. You are older than 50 years of age and your  health care provider tells you that you are at risk for this type of infection. Your sexual activity has changed since you were last screened, and you are at increased risk for chlamydia or gonorrhea. Ask your health care provider if you are at risk. Ask your health care provider about whether you are at high risk for HIV. Your health care provider  may recommend a prescription medicine to help prevent HIV infection. If you choose to take medicine to prevent HIV, you should first get tested for HIV. You should then be tested every 3 months for as long as you are taking the medicine. Follow these instructions at home: Alcohol use Do not drink alcohol if your health care provider tells you not to drink. If you drink alcohol: Limit how much you have to 0-2 drinks a day. Know how much alcohol is in your drink. In the [place] one drink equals one 12 oz bottle of beer (355 mL), one 5 oz glass of wine (148 mL), or one 1 oz glass of hard liquor (44 mL). Lifestyle Do not use any products that contain nicotine or tobacco. These products include cigarettes, chewing tobacco, and vaping devices, such as e-cigarettes. If you need help quitting, ask your health care provider. Do not use street drugs. Do not share needles. Ask your health care provider for help if you need support or information about quitting drugs. General instructions Schedule regular health, dental, and eye exams. Stay current with your vaccines. Tell your health care provider if: You often feel depressed. You have ever been abused or do not feel safe at home. Summary Adopting a healthy lifestyle and getting preventive care are important in promoting health and wellness. Follow your health care provider's instructions about healthy diet, exercising, and getting tested or screened for diseases. Follow your health care provider's instructions on monitoring your cholesterol and blood pressure. This information is not intended to replace advice given to you by your health care provider. Make sure you discuss any questions you have with your health care provider. Document Revised: 02/27/2021 Document Reviewed: 02/27/2021 Elsevier Patient Education  2024 [employer]  Health Maintenance, Male Adopting a healthy lifestyle and getting preventive care are important in promoting health  and wellness. Ask your health care provider about: The right schedule for you to have regular tests and exams. Things you can do on your own to prevent diseases and keep yourself healthy. What should I know about diet, weight, and exercise? Eat a healthy diet  Eat a diet that includes plenty of vegetables, fruits, low-fat dairy products, and lean protein. Do not eat a lot of foods that are high in solid fats, added sugars, or sodium. Maintain a healthy weight Body mass index (BMI) is a measurement that can be used to identify possible weight problems. It estimates body fat based on height and weight. Your health care provider can help determine your BMI and help you achieve or maintain a healthy weight. Get regular exercise Get regular exercise. This is one of the most important things you can do for your health. Most adults should: Exercise for at least 150 minutes each week. The exercise should increase your heart rate and make you sweat (moderate-intensity exercise). Do strengthening exercises at least twice a week. This is in addition to the moderate-intensity exercise. Spend less time sitting. Even light physical activity can be beneficial. Watch cholesterol and blood lipids Have your blood tested for lipids and cholesterol at 50 years of age, then have this  test every 5 years. You may need to have your cholesterol levels checked more often if: Your lipid or cholesterol levels are high. You are older than 50 years of age. You are at high risk for heart disease. What should I know about cancer screening? Many types of cancers can be detected early and may often be prevented. Depending on your health history and family history, you may need to have cancer screening at various ages. This may include screening for: Colorectal cancer. Prostate cancer. Skin cancer. Lung cancer. What should I know about heart disease, diabetes, and high blood pressure? Blood pressure and heart disease High  blood pressure causes heart disease and increases the risk of stroke. This is more likely to develop in people who have high blood pressure readings or are overweight. Talk with your health care provider about your target blood pressure readings. Have your blood pressure checked: Every 3-5 years if you are 40-5 years of age. Every year if you are 64 years old or older. If you are between the ages of 45 and 4 and are a current or former smoker, ask your health care provider if you should have a one-time screening for abdominal aortic aneurysm (AAA). Diabetes Have regular diabetes screenings. This checks your fasting blood sugar level. Have the screening done: Once every three years after age 13 if you are at a normal weight and have a low risk for diabetes. More often and at a younger age if you are overweight or have a high risk for diabetes. What should I know about preventing infection? Hepatitis B If you have a higher risk for hepatitis B, you should be screened for this virus. Talk with your health care provider to find out if you are at risk for hepatitis B infection. Hepatitis C Blood testing is recommended for: Everyone born from [birth year] through 1967. Anyone with known risk factors for hepatitis C. Sexually transmitted infections (STIs) You should be screened each year for STIs, including gonorrhea and chlamydia, if: You are sexually active and are younger than 50 years of age. You are older than 50 years of age and your health care provider tells you that you are at risk for this type of infection. Your sexual activity has changed since you were last screened, and you are at increased risk for chlamydia or gonorrhea. Ask your health care provider if you are at risk. Ask your health care provider about whether you are at high risk for HIV. Your health care provider may recommend a prescription medicine to help prevent HIV infection. If you choose to take medicine to prevent HIV, you  should first get tested for HIV. You should then be tested every 3 months for as long as you are taking the medicine. Follow these instructions at home: Alcohol use Do not drink alcohol if your health care provider tells you not to drink. If you drink alcohol: Limit how much you have to 0-2 drinks a day. Know how much alcohol is in your drink. In the [place] one drink equals one 12 oz bottle of beer (355 mL), one 5 oz glass of wine (148 mL), or one 1 oz glass of hard liquor (44 mL). Lifestyle Do not use any products that contain nicotine or tobacco. These products include cigarettes, chewing tobacco, and vaping devices, such as e-cigarettes. If you need help quitting, ask your health care provider. Do not use street drugs. Do not share needles. Ask your health care provider for help if you need  support or information about quitting drugs. General instructions Schedule regular health, dental, and eye exams. Stay current with your vaccines. Tell your health care provider if: You often feel depressed. You have ever been abused or do not feel safe at home. Summary Adopting a healthy lifestyle and getting preventive care are important in promoting health and wellness. Follow your health care provider's instructions about healthy diet, exercising, and getting tested or screened for diseases. Follow your health care provider's instructions on monitoring your cholesterol and blood pressure. This information is not intended to replace advice given to you by your health care provider. Make sure you discuss any questions you have with your health care provider. Document Revised: 02/27/2021 Document Reviewed: 02/27/2021 Elsevier Patient Education  2024 [employer].

## 2024-08-11 NOTE — Addendum Note (Signed)
 Addended by: ANTONETTE ANGELINE ORN on: 08/11/2024 09:36 AM   Modules accepted: Level of Service

## 2024-08-11 NOTE — Progress Notes (Addendum)
 Subjective:    Patient ID: Julian Tran, male    DOB: 07/27/1976, 48 y.o.   MRN: [id number]  HPI  Patient presents to clinic today for his annual exam.  He also has concerns about sleep apnea.  He reports his wife has witnessed him snoring and having apneic episodes.  He does not feel rested when he wakes up.  He does not nap during the day.  He also reports urinary frequency and hesitancy.  He noticed this 2 months ago.  He denies nocturia, dysuria, blood in his urine.  He has not tried anything OTC for this.  Flu: 06/2019 Tetanus: 12/2014 COVID: never Shingrix: never PSA screening: 12/2014 Colon screening: 02/2020, 3 years Vision screening: annually Dentist: biannually  Diet: He does eat meat. He does consumes fruits and veggies. He does eat fried foods. He drinks mostly water, coffee, beer. Exercise: Walking, golf  Review of Systems     Past Medical History:  Diagnosis Date   Allergy    Chicken pox    Vitamin D  deficiency     Current Outpatient Medications  Medication Sig Dispense Refill   Aspirin-Acetaminophen -Caffeine (EXCEDRIN PO) Take by mouth. PRN     fexofenadine (ALLEGRA) 180 MG tablet PRN     No current facility-administered medications for this visit.    No Known Allergies  Family History  Problem Relation Age of Onset   Depression Mother    Alcohol abuse Father    Alcohol abuse Brother    Alcohol abuse Brother    Diabetes Neg Hx    Heart disease Neg Hx    Stroke Neg Hx    Colon cancer Neg Hx    Esophageal cancer Neg Hx    Rectal cancer Neg Hx    Stomach cancer Neg Hx     Social History   Socioeconomic History   Marital status: Married    Spouse name: Not on file   Number of children: Not on file   Years of education: Not on file   Highest education level: Associate degree: occupational, Scientist, product/process development, or vocational program  Occupational History   Not on file  Tobacco Use   Smoking status: Never   Smokeless tobacco: Never  Vaping Use    Vaping status: Never Used  Substance and Sexual Activity   Alcohol use: Yes    Alcohol/week: 3.0 standard drinks of alcohol    Types: 3 Standard drinks or equivalent per week    Comment: OCCASIONAL   Drug use: No   Sexual activity: Yes    Birth control/protection: Surgical  Other Topics Concern   Not on file  Social History Narrative   Not on file   Social Drivers of Health   Financial Resource Strain: Low Risk  (08/11/2024)   Overall Financial Resource Strain (CARDIA)    Difficulty of Paying Living Expenses: Not hard at all  Food Insecurity: No Food Insecurity (08/11/2024)   Hunger Vital Sign    Worried About Running Out of Food in the Last Year: Never true    Ran Out of Food in the Last Year: Never true  Transportation Needs: No Transportation Needs (08/11/2024)   PRAPARE - Administrator, Civil Service (Medical): No    Lack of Transportation (Non-Medical): No  Physical Activity: Insufficiently Active (08/11/2024)   Exercise Vital Sign    Days of Exercise per Week: 3 days    Minutes of Exercise per Session: 30 min  Stress: No Stress Concern Present (08/11/2024)  [employer] of Occupational Health - Occupational Stress Questionnaire    Feeling of Stress: Not at all  Social Connections: Moderately Integrated (08/11/2024)   Social Connection and Isolation Panel    Frequency of Communication with Friends and Family: Once a week    Frequency of Social Gatherings with Friends and Family: Once a week    Attends Religious Services: More than 4 times per year    Active Member of [employer] or Organizations: Yes    Attends Engineer, structural: More than 4 times per year    Marital Status: Married  Catering manager Violence: Not on file     Constitutional: Denies fever, malaise, fatigue, headache or abrupt weight changes.  HEENT: Denies eye pain, eye redness, ear pain, ringing in the ears, wax buildup, runny nose, nasal congestion, bloody nose, or sore  throat. Respiratory: Denies difficulty breathing, shortness of breath, cough or sputum production.   Cardiovascular: Denies chest pain, chest tightness, palpitations or swelling in the hands or feet.  Gastrointestinal: Denies abdominal pain, bloating, constipation, diarrhea or blood in the stool.  GU: Pt reports urinary frequency and hesitancy. Denies urgency, pain with urination, burning sensation, blood in urine, odor or discharge. Musculoskeletal: Patient reports intermittent low back pain.  Denies decrease in range of motion, difficulty with gait, or joint swelling.  Skin: Denies redness, rashes, lesions or ulcercations.  Neurological: Pt reports snoring, apnea. Denies dizziness, difficulty with memory, difficulty with speech or problems with balance and coordination.  Psych: Denies anxiety, depression, SI/HI.  No other specific complaints in a complete review of systems (except as listed in HPI above).  Objective:   Physical Exam  BP 124/88 (BP Location: Left Arm, Patient Position: Sitting, Cuff Size: Large)   Ht 5' 10 (1.778 m)   Wt 238 lb (108 kg)   BMI 34.15 kg/m    Wt Readings from Last 3 Encounters:  02/20/22 225 lb (102.1 kg)  03/08/20 229 lb (103.9 kg)  02/23/20 229 lb 12.8 oz (104.2 kg)    General: Appears his stated age, obese, in NAD. Skin: Warm, dry and intact.  HEENT: Head: normal shape and size; Eyes: sclera white, no icterus, conjunctiva pink, PERRLA and EOMs intact;  Neck: Neck supple, trachea midline. No masses, lumps or thyromegaly present.  Cardiovascular: Normal rate and rhythm. S1,S2 noted.  No murmur, rubs or gallops noted. No JVD or BLE edema. No carotid bruits noted. Pulmonary/Chest: Normal effort and positive vesicular breath sounds. No respiratory distress. No wheezes, rales or ronchi noted.  Abdomen: Soft and nontender. Normal bowel sounds.  Musculoskeletal: Strength 5/5 BUE/BLE.  No difficulty with gait.  Neurological: Alert and oriented. Cranial  nerves II-XII grossly intact. Coordination normal.  Psychiatric: Mood and affect normal. Behavior is normal. Judgment and thought content normal.     BMET    Component Value Date/Time   NA 143 02/20/2022 0848   NA 143 01/13/2015 1524   K 4.4 02/20/2022 0848   CL 107 02/20/2022 0848   CO2 29 02/20/2022 0848   GLUCOSE 107 (H) 02/20/2022 0848   BUN 19 02/20/2022 0848   BUN 12 01/13/2015 1524   CREATININE 0.74 02/20/2022 0848   CALCIUM 9.3 02/20/2022 0848   GFRNONAA 111 01/13/2015 1524   GFRAA 128 01/13/2015 1524    Lipid Panel     Component Value Date/Time   CHOL 160 02/20/2022 0848   TRIG 66 02/20/2022 0848   TRIG 66 01/13/2015 1524   HDL 53 02/20/2022 0848   HDL  55 01/13/2015 1524   CHOLHDL 3.0 02/20/2022 0848   VLDL 21.4 02/01/2020 0855   LDLCALC 92 02/20/2022 0848    CBC    Component Value Date/Time   WBC 7.6 02/20/2022 0848   RBC 5.11 02/20/2022 0848   HGB 16.9 02/20/2022 0848   HCT 48.1 02/20/2022 0848   PLT 200 02/20/2022 0848   MCV 94.1 02/20/2022 0848   MCV 95.5 01/13/2015 1536   MCH 33.1 (H) 02/20/2022 0848   MCHC 35.1 02/20/2022 0848   RDW 12.1 02/20/2022 0848    Hgb A1C Lab Results  Component Value Date   HGBA1C 5.2 02/20/2022           Assessment & Plan:   Preventative Health Maintenance:  Flu shot today Tetanus UTD Encouraged him to get a COVID-vaccine Discussed Shingrix vaccine, he will check coverage with his insurance company and schedule visit if he would like to have that done Referral to GI for screening colonoscopy Encouraged him to consume a balanced diet and exercise regimen Advised him to see an eye doctor and dentist annually We will check CBC, c-Met, lipid, A1c and PSA today  Snoring, apneic episodes, excessive daytime sleepiness:  Neck measurement: 16.5 inches ESS score of 6 Referral for home sleep test through [facility]  Urinary frequency, urinary hesitancy:  Urinalysis normal Likely related to BPH He  is not interested in medication management at this time but I did suggest saw palmetto OTC for prostate health  RTC in 1 year, sooner if needed  Angeline Laura, NP

## 2024-08-11 NOTE — Assessment & Plan Note (Signed)
 Encourage diet and exercise for weight loss

## 2024-08-12 ENCOUNTER — Other Ambulatory Visit: Payer: Self-pay | Admitting: Internal Medicine

## 2024-08-12 DIAGNOSIS — R748 Abnormal levels of other serum enzymes: Secondary | ICD-10-CM

## 2025-08-16 ENCOUNTER — Encounter: Admitting: Internal Medicine
